# Patient Record
Sex: Male | Born: 1950 | Race: White | Hispanic: No | Marital: Married | Smoking: Former smoker
Health system: Southern US, Academic
[De-identification: ages and names within clinical notes are randomized; demographics above are authoritative.]

## PROBLEM LIST (undated history)

## (undated) DIAGNOSIS — C439 Malignant melanoma of skin, unspecified: Secondary | ICD-10-CM

## (undated) DIAGNOSIS — F419 Anxiety disorder, unspecified: Secondary | ICD-10-CM

## (undated) DIAGNOSIS — E785 Hyperlipidemia, unspecified: Secondary | ICD-10-CM

## (undated) DIAGNOSIS — I10 Essential (primary) hypertension: Secondary | ICD-10-CM

## (undated) DIAGNOSIS — C4491 Basal cell carcinoma of skin, unspecified: Secondary | ICD-10-CM

## (undated) HISTORY — PX: HX WISDOM TEETH EXTRACTION: SHX21

## (undated) HISTORY — PX: HX TONSILLECTOMY: SHX27

## (undated) HISTORY — DX: Malignant melanoma of skin, unspecified (CMS HCC): C43.9

## (undated) HISTORY — DX: Anxiety disorder, unspecified: F41.9

## (undated) HISTORY — PX: COLONOSCOPY: WVUENDOPRO10

---

## 1997-11-30 ENCOUNTER — Ambulatory Visit (HOSPITAL_COMMUNITY): Payer: Self-pay

## 2005-01-09 HISTORY — PX: HX LUMBAR SPINE SURGERY: 2100001196

## 2011-01-23 ENCOUNTER — Ambulatory Visit: Payer: BC Managed Care – PPO | Attending: Critical Care Medicine | Admitting: Critical Care Medicine

## 2011-01-23 ENCOUNTER — Encounter (INDEPENDENT_AMBULATORY_CARE_PROVIDER_SITE_OTHER): Payer: Self-pay | Admitting: Critical Care Medicine

## 2011-01-23 DIAGNOSIS — Z23 Encounter for immunization: Secondary | ICD-10-CM | POA: Insufficient documentation

## 2011-01-23 DIAGNOSIS — I10 Essential (primary) hypertension: Secondary | ICD-10-CM | POA: Insufficient documentation

## 2011-01-23 DIAGNOSIS — J189 Pneumonia, unspecified organism: Secondary | ICD-10-CM | POA: Insufficient documentation

## 2011-01-23 DIAGNOSIS — F411 Generalized anxiety disorder: Secondary | ICD-10-CM | POA: Insufficient documentation

## 2011-01-23 NOTE — Progress Notes (Signed)
Dictated

## 2011-01-24 NOTE — H&P (Signed)
[facility] AND [facility]                              DEPARTMENT OF MEDICINE                                [place], [place] [address]                                PATIENT NAME: Ian Ellis, Ian Ellis XBJYNW:[id number]  DATE OF SERVICE:01/23/2011  DATE OF BIRTH: 27-Nov-1950    HISTORY AND PHYSICAL    REFERRING AND PRIMARY CARE PHYSICIAN:  Jose Persia MD, [place], [place].    CHIEF COMPLAINT:  Nonresolving pneumonia versus other inflammatory process.    HISTORY OF PRESENT ILLNESS:  Mr. Leamer is a 61 year old gentleman being referred by Dr. Hal Hope for abnormal x-ray and slow to resolve pneumonia.  In November of 2012 the patient developed shortness of breath, fever and chills and was diagnosed November 28, 2010, with a pneumonia on chest x-ray.  The patient was given a script for Levaquin and came back in 1 week.  On the day of return, the patient felt better.  A CT scan was performed December 08, 2010, that showed ongoing right middle lobe opacity despite not having any symptoms at that time so the patient was referred to me for further evaluation.  Currently, Mr. Nevares denies any specific complaints.  He denies having current fevers, chills, nausea, vomiting, shortness of breath, cough or hemoptysis.  He feels significantly better than he did in November when he was diagnosed with pneumonia and since that time has really not had any issues whatsoever.  He continues to be a very active man, running anywhere from 7 to 9 every day and has not had any difficulty with that.    REVIEW OF SYSTEMS:  Positive for above, negative for all else.    PAST MEDICAL HISTORY:  1.  Hypertension.  2.  Melanoma, status post resection.  3.  Pneumonia x3, most recently in November, prior to that was many years ago.  4.  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  5.  Pancreatitis.    MEDICATIONS:  1.  Valium which he takes for anxiety approximately twice weekly.   2.  Voltaren which he takes for chronic back pain.  3.  Amlodipine.  4.  Ambien 10 mg 1 p.o. nightly p.r.n. which he takes approximately once every few days.  5.  Albuterol which he is not using at all.  He did use while he was acutely ill with the pneumonia but since that time he has not used it at all.      PAST SURGICAL HISTORY:  1.  Lumbar back surgery.  2.  Melanoma removal.  3.  Hemorrhoidectomy.  4.  Wisdom teeth removal.    ALLERGIES:  Amoxicillin which causes hives.    SOCIAL HISTORY:  He has been married 37 years.  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  He denies any exposure to asbestos, coal dust, silica or chemicals.  He did previously smoke a pack a day for approximately 10 years but quit 35 years ago.  He does occasionally smoke a cigar every 2-3 weeks.  He has 1 child who is healthy.  He does not live on  a farm and he has no pets at home.    FAMILY HISTORY:  Significant for coronary artery disease in his father and emphysema.  Mother has Alzheimer's.  There is no history of lung cancer in the family that he is aware of, or COPD or asthma.    OBJECTIVE:  He is 5 feet 9 inches, weighs 142 pounds, his temperature is 98.1 degrees Fahrenheit, pulse is 47, blood pressure is 130/76 and he is saturating 100% on room air.  Generally, he is alert and oriented.  Head is normocephalic, atraumatic.  ENT reveals eyes that are clear and mucous membranes that are moist.  Heart is regular rate and rhythm, normal S1, S2 with no murmur, rub or gallop.  Lungs are clear to auscultation bilaterally with no wheezes, rhonchi or rales.  Abdomen is soft, nontender.  Extremities show no edema, cyanosis or clubbing.    IMAGING:  CAT scan performed December 08, 2010, from the outside facility was reviewed by myself and showed anterior right middle lobe opacity that, if I had to guess, is resolving but I have no prior studies to compare it to.    ASSESSMENT AND PLAN:   1.  Resolving pneumonia.  We will have the patient repeat a CAT scan in approximately 2 weeks, which will be about 2 months out from his previous CAT scan, to ensure that this right middle lobe opacity is completely resolved.  If that is the case, then he will not need to follow with me any further.  However, if he still has any changes in his lungs on his CAT scan, we will evaluate that at that time possibly with a bronchoscopy.  2.  Immunizations.  The patient is up to date with his flu shot which he had in the fall and his pneumonia shot which he was encouraged to check with his primary care physician regarding.  3.  History of hypertension.  The patient is relatively well controlled now and should continue to follow with his primary care physician regarding this.  Continue his amlodipine.  4.  History of anxiety.  The patient should continue his Valium on a p.r.n. basis.  5.  Chronic back pain secondary to bulging disk.  The patient should continue his Voltaren and follow with his PCP.  6.  History of pancreatitis.  This is stable at this time.  7.  Return to clinic.  We will have the patient return to clinic in approximately 2 months if he has ongoing abnormal CT in 2 weeks.  In approximately 2 weeks we will have him do a CT chest noncontrast to evaluate his pulmonary parenchyma to assure this opacity is improved.  If it is not, we will evaluate him and make some more decisions at that time; specifically, bronchoscopy.      Lars Pinks, DO  Assistant Professor, Section of Pulmonary and Critical Care  [facility]    ZO/[id number]; D: 01/24/2011 08:25:06; T: 01/24/2011 10:00:56    cc: Rella Larve MD      [address] [place] [address]

## 2011-02-06 ENCOUNTER — Other Ambulatory Visit (HOSPITAL_COMMUNITY): Payer: Self-pay

## 2011-02-07 ENCOUNTER — Ambulatory Visit
Admission: RE | Admit: 2011-02-07 | Discharge: 2011-02-07 | Disposition: A | Discharge status: Home / Self Care | Payer: BC Managed Care – PPO | Source: Ambulatory Visit | Attending: Critical Care Medicine | Admitting: Critical Care Medicine

## 2011-02-07 DIAGNOSIS — R0602 Shortness of breath: Secondary | ICD-10-CM | POA: Insufficient documentation

## 2011-02-07 DIAGNOSIS — J189 Pneumonia, unspecified organism: Secondary | ICD-10-CM | POA: Insufficient documentation

## 2011-02-07 DIAGNOSIS — R942 Abnormal results of pulmonary function studies: Secondary | ICD-10-CM | POA: Insufficient documentation

## 2011-02-08 ENCOUNTER — Encounter (INDEPENDENT_AMBULATORY_CARE_PROVIDER_SITE_OTHER): Payer: Self-pay | Admitting: Critical Care Medicine

## 2011-02-08 ENCOUNTER — Other Ambulatory Visit (INDEPENDENT_AMBULATORY_CARE_PROVIDER_SITE_OTHER): Payer: Self-pay | Admitting: Critical Care Medicine

## 2011-02-08 NOTE — Progress Notes (Signed)
I reviewed Ian Ellis CT chest.  It shows minimal right middle lobe ground glass, drastically improved from his outside CT from 12/20/10.  He denies complaints.  This represents a slowly resolving pneumonia.  I will check a repeat CT chest non contrast in 3 months from now to ensure resolution.  This will be performed the same day as his follow up appointment.  We will cancel his 04/12/11 appointment.  Ian Ellis voiced understanding.      Reymundo Poll, DO  Assistant Professor  Pulmonary and Critical Care Medicine  Department of Internal Medicine

## 2011-04-12 ENCOUNTER — Encounter (INDEPENDENT_AMBULATORY_CARE_PROVIDER_SITE_OTHER): Payer: BC Managed Care – PPO | Admitting: Critical Care Medicine

## 2011-05-10 ENCOUNTER — Other Ambulatory Visit (HOSPITAL_COMMUNITY): Payer: Self-pay

## 2011-05-15 ENCOUNTER — Other Ambulatory Visit (HOSPITAL_COMMUNITY): Payer: Self-pay

## 2011-05-15 ENCOUNTER — Encounter (INDEPENDENT_AMBULATORY_CARE_PROVIDER_SITE_OTHER): Payer: BC Managed Care – PPO | Admitting: Critical Care Medicine

## 2011-05-24 ENCOUNTER — Ambulatory Visit
Admission: RE | Admit: 2011-05-24 | Discharge: 2011-05-24 | Disposition: A | Discharge status: Home / Self Care | Payer: BC Managed Care – PPO | Source: Ambulatory Visit | Attending: Critical Care Medicine | Admitting: Critical Care Medicine

## 2011-05-24 ENCOUNTER — Encounter (INDEPENDENT_AMBULATORY_CARE_PROVIDER_SITE_OTHER): Payer: Self-pay | Admitting: Critical Care Medicine

## 2011-05-24 ENCOUNTER — Ambulatory Visit (INDEPENDENT_AMBULATORY_CARE_PROVIDER_SITE_OTHER): Payer: BC Managed Care – PPO | Admitting: Critical Care Medicine

## 2011-05-24 VITALS — BP 128/67 | HR 47 | Temp 99.4°F | Ht 67.32 in | Wt 145.9 lb

## 2011-05-24 NOTE — Progress Notes (Signed)
Dictated

## 2011-05-25 NOTE — Progress Notes (Signed)
[facility] AND [facility]                              DEPARTMENT OF MEDICINE                                [place], [place] [address]                                PATIENT NAME: TRENTEN, WATCHMAN VHQION:[id number]  DATE OF SERVICE:05/24/2011  DATE OF BIRTH: 01-02-1951    PROGRESS NOTE    SUBJECTIVE:  Mr. Kiester is a regular patient in the pulmonary clinic following up for his history of abnormal CT that was most likely a slowly resolving pneumonia.  The patient reports doing very well at this time.  He denies fevers, chills, chest pain, shortness of breath, nausea, vomiting along with all else.  He did have his CAT scan performed today.    REVIEW OF SYSTEMS:  Positive for above, negative for all else.    OBJECTIVE:  He is 171 cm or 5 feet 7 inches tall. He weighs 66 kilograms or 145 pounds.  His temperature is 99.4.  Pulse is 47.  Blood pressure is 128/67.  He is saturating 96% on room air.  Generally, he is alert and oriented.  Head is normocephalic, atraumatic.  ENT reveals eyes that are clear.  Mucous membranes that are moist.  Heart is regular rate and rhythm, normal S1 and S2 with no murmur, rub or gallop.  Lungs are clear to auscultation bilaterally with no wheezes, rhonchi or rales.  Abdomen is soft, nontender, nondistended, positive bowel sounds.  Extremities show no edema, cyanosis or clubbing.      STUDIES:  CAT scan performed today revealed clear lung fields bilaterally with no signs of previous infiltrate.    ASSESSMENT AND PLAN:  1.  Slowly resolving pneumonia.  Previous abnormal CT chest likely just a slowly resolving pneumonia as his current CAT scan completely clear.  No other abnormal findings on his CT chest.  2.  Immunizations.  The patient is up to date with his flu shot, which he had in the fall and his pneumonia shot which he had in 2013.   3.  Return to clinic.  We will have the patient return to clinic on a p.r.n. basis.  He was encouraged to contact our office if he has any issues; but otherwise, we will see him as needed.      Lars Pinks, DO  Assistant Professor, Section of Pulmonary and Critical Care  [facility]    [id number]; D: 05/24/2011 16:16:25; T: 05/25/2011 00:48:37

## 2012-01-10 HISTORY — PX: HX HIP REPLACEMENT: SHX124

## 2013-08-07 ENCOUNTER — Ambulatory Visit (INDEPENDENT_AMBULATORY_CARE_PROVIDER_SITE_OTHER): Payer: BC Managed Care – PPO | Admitting: Otolaryngology

## 2014-01-09 HISTORY — PX: HX OTHER: 2100001105

## 2014-06-04 ENCOUNTER — Ambulatory Visit (HOSPITAL_BASED_OUTPATIENT_CLINIC_OR_DEPARTMENT_OTHER): Payer: 59 | Admitting: Neurological Surgery

## 2014-06-04 ENCOUNTER — Encounter (INDEPENDENT_AMBULATORY_CARE_PROVIDER_SITE_OTHER): Payer: Self-pay | Admitting: Neurological Surgery

## 2014-06-04 ENCOUNTER — Ambulatory Visit (HOSPITAL_BASED_OUTPATIENT_CLINIC_OR_DEPARTMENT_OTHER): Payer: 59

## 2014-06-04 ENCOUNTER — Ambulatory Visit
Admission: RE | Admit: 2014-06-04 | Discharge: 2014-06-04 | Disposition: A | Discharge status: Home / Self Care | Payer: 59 | Source: Ambulatory Visit | Attending: Neurological Surgery | Admitting: Neurological Surgery

## 2014-06-04 VITALS — BP 110/60 | HR 45 | Temp 96.5°F | Ht 69.49 in | Wt 158.7 lb

## 2014-06-04 DIAGNOSIS — M47816 Spondylosis without myelopathy or radiculopathy, lumbar region: Secondary | ICD-10-CM

## 2014-06-04 DIAGNOSIS — M4692 Unspecified inflammatory spondylopathy, cervical region: Secondary | ICD-10-CM

## 2014-06-04 DIAGNOSIS — M542 Cervicalgia: Secondary | ICD-10-CM | POA: Insufficient documentation

## 2014-06-04 DIAGNOSIS — M5412 Radiculopathy, cervical region: Secondary | ICD-10-CM | POA: Insufficient documentation

## 2014-06-04 DIAGNOSIS — M47892 Other spondylosis, cervical region: Secondary | ICD-10-CM

## 2014-06-04 DIAGNOSIS — M479 Spondylosis, unspecified: Secondary | ICD-10-CM

## 2014-06-04 DIAGNOSIS — Z87891 Personal history of nicotine dependence: Secondary | ICD-10-CM | POA: Insufficient documentation

## 2014-06-04 DIAGNOSIS — M5136 Other intervertebral disc degeneration, lumbar region: Secondary | ICD-10-CM

## 2014-06-04 DIAGNOSIS — M4312 Spondylolisthesis, cervical region: Secondary | ICD-10-CM

## 2014-06-04 DIAGNOSIS — M5030 Other cervical disc degeneration, unspecified cervical region: Secondary | ICD-10-CM

## 2014-06-04 DIAGNOSIS — R29890 Loss of height: Secondary | ICD-10-CM

## 2014-06-04 MED ORDER — CYCLOBENZAPRINE 10 MG TABLET
10.00 mg | ORAL_TABLET | Freq: Three times a day (TID) | ORAL | Status: AC
Start: 2014-06-04 — End: 2014-07-04

## 2014-06-04 NOTE — Addendum Note (Signed)
Addended by: Lindie Spruce on: 06/04/2014 12:04 PM     Modules accepted: Orders

## 2014-06-04 NOTE — Progress Notes (Addendum)
[facility]  New Outpatient/Consult    Ian Ellis  Date of Service: 06/04/2014  Referring physician: Call, Reymundo Poll, MD  [place], [place] [address]    Gender: male  Handedness: Left handed  Marital Status: Married   Job Title (or Former Job): Teacher (astronomy)    Chief Complaint:   Chief Complaint   Patient presents with    Lower Back Pain    Neck Pain     History is provided by patient    History of Present Illness  (Location/Quality/Severity/Duration/Timing/Context/Modifying factors/Associated S&S)      Past History  Current Outpatient Prescriptions   Medication Sig    amLODIPine (NORVASC) 5 mg Oral Tablet take 5 mg by mouth Once a day.      ASPIRIN/ACETAMINOPHEN/CAFFEINE (EXCEDRIN MIGRAINE ORAL) take 2 Tabs by mouth Every 12 hours as needed.      atorvastatin (LIPITOR) 20 mg Oral Tablet Take 20 mg by mouth Every evening    citalopram (CELEXA) 40 mg Oral Tablet Take 40 mg by mouth Once a day    docusate sodium (COLACE) 100 mg Oral Capsule take 100 mg by mouth Twice daily.      melatonin 10 mg Oral Tablet take 1 Tab by mouth Once a day.      MULTI-VITAMIN ORAL take 1 Tab by mouth Once a day.      oxyCODONE-acetaminophen (PERCOCET) 10-325 mg Oral Tablet Take 1 Tab by mouth Every 4 hours as needed for Pain    PSYLLIUM HUSK (METAMUCIL ORAL) take 1 Tab by mouth Once a day.      Trazodone 150 mg Oral Tablet Sustained Release 24 hr Take 100 mg by mouth Once a day     Allergies   Allergen Reactions    Amoxicillin Anaphylaxis and Hives/ Urticaria     Past Medical History   Diagnosis Date    Anxiety     Cancer          Past Surgical History   Procedure Laterality Date    Hx lumbar spine surgery      Hx tonsillectomy      Hx exposure to metal shavings      Hx metal implants in body      Hx hip replacement             Family History  Family History   Problem Relation Age of Onset    Alzheimer's/Dementia Mother     Emphysema Father            Social History  History      Social History    Marital Status: Married     Spouse Name: N/A    Number of Children: 1    Years of Education: N/A     Social History Main Topics    Smoking status: Former Smoker -- 1.00 packs/day for 10 years     Types: Cigars     Quit date: 01/23/1983    Smokeless tobacco: Not on file      Comment: occasional cigar    Alcohol Use: No    Drug Use: No    Sexual Activity: Not on file     Other Topics Concern    Uses Cane No    Uses Walker No    Uses Wheelchair No    Right Hand Dominant No    Left Hand Dominant Yes    Ambidextrous No    Shift Work No    Unusual Sleep-Wake  Schedule No     Social History Narrative       Review of Systems  Constitutional: negative  Eyes: negative  Ears, nose, mouth, throat, and face: negative  Respiratory: negative  Cardiovascular: negative  Gastrointestinal: negative  Genitourinary:negative  Integument/breast: positive for skin lesion(s)  Hematologic/lymphatic: negative  Musculoskeletal:positive for neck pain and back pain  Neurological: negative  Behavioral/Psych: positive for anxiety  Endocrine: negative  Allergic/Immunologic: negative  All other ROS Negative    Examination  BP 110/60 mmHg   Pulse 45   Temp(Src) 35.8 C (96.5 F) (Tympanic)   Ht 1.765 m (5' 9.49")   Wt 72 kg (158 lb 11.7 oz)   BMI 23.11 kg/m2   SpO2 93%  CURRENT WEIGHT: @WTENG @  BMI: @BMI1 @      Constitutional  Generally appearing stated age, not overweight, male, appropriate mood and affect.     NEUROLOGIC:  Alert and oriented x3.  Speech appropriate.  Cognition is intact. Memory intact. Attention span appropriate.    Cranial Nerves: Pupils equal and reactive to light, 3, 4, 6 good ocular motion.  Visual fields gross inspection intact. Facial sensation and motor intact.  No Nystagmus. Tongue midline.  Normal palate elevation.  Hearing intact to finger rub.  Eleven has good strength.   Motor:  Motor exam is 5/5 in all 4 extremities with exceptions:       Right   Left              Deltoids  Biceps  Triceps  Wrist Ext:  Wrist Flex  Grip  Hip Flex  Hip Ext  Quad  Hamstring  Plantar Flex.  Dorsal Flex.  EHL:  Foot eversion    Sensation: Sensation was intact to light touch, pain, temperature except 1/2 LT right C6  DTR:   Tricep 2+, Bicep 2+, Brachioradialis 2+ , Patellar 3+, Achilles 0. Hoffman negative B/L. Negative clonus of ankles with dorsiflexion.   Cerebellar: Exam is without dysmetria.  No past pointing. No ataxia  GAIT:  tandem, without cane or walker, standard cadence  SKIN EXAM:  No eschars or ulcerations throughout head, neck, RUE, LUE, RLE, LLE.  Warm, dry, pink  MUSCULOSKELETAL.  No muscular atrophy. No fasciculations. No spasticity. :  No joint deformity.   HEENT:  Normocephalic, atraumatic. Mucous membranes moist   Cardiovascular: Normal rate, normal rhythm,   Respiration: Equal chest rise. No wheezing. Non-rhonchus  Gastrointestinal:  Soft, non-tender, non-distended, non-obese    Data reviewed    LATEST RADIOLOGY RESULTS: No results were found from the past 30 days.    None to review    Discussions with other providers:     Assessment:    No diagnosis found.    Treatment Plan    The patient was seen independently.    [place] 06/04/2014, 11:10    CC:  Reymundo Poll Call, MD Call, Reymundo Poll, MD @REFER @    Late entry for 06/04/2014. I personally saw and evaluated the patient. See mid-level's note for additional details. My findings/participation are dictated    Lindie Spruce, MD

## 2014-06-04 NOTE — H&P (Signed)
[place], [place] [address]                                PATIENT NAME: Ian Ellis, Ian Ellis PRXYVO:[id number]  DATE OF SERVICE:06/04/2014  DATE OF BIRTH: 02-19-1950    HISTORY AND PHYSICAL    HISTORY OF PRESENT ILLNESS:  Ian Ellis is a very pleasant 64 year old male that presents to neurosurgery clinic with complaints of neck pain, right-sided arm radiating radicular symptoms and back pain without radiculopathy.  He has had back pain for years and underwent a minimally invasive L4-L5 decompression in the past and with good relief of his radiating symptoms but with chronic pain.  Over the last year, he started to develop neck pain with right side radiculopathy in a C6 distribution.  He had an MRI of the cervical spine and underwent a single epidural steroid injection which resolved this pain at least for 3-6 months, but now he states it is starting to become recurrent.  He presents in clinic with an MRI of the lumbar spine and a report of the cervical.    ASSESSMENT:  Ian Ellis is a 64 year old male that has neck pain and right C6 radiculopathy.  He has not been through physical therapy but has had Percocet pain medications but no muscle relaxants.  Per report, his MRI demonstrates that he has C5-C6 retrolisthesis and compression of his right nerve, concordant with his symptoms.  He underwent epidural steroid injection with relief and now it is recurrent.  His MRI of his back demonstrates L2-L3 and L3-L4 lumbar spondylosis.  He has actual back pain, but no radiculopathy.    RECOMMENDATIONS:  1.  Physical therapy 2 times a week for 4 weeks for neck and back exercises.  2.  Flexeril for muscle relaxants.  3.  Stop the Percocet and begin naproxen.  4.  Obtain his MRI of the cervical spine.  5.  He may benefit from an anterior cervical diskectomy and fusion at the C5-C6 but I need  to see the images.  Present back in 4 weeks.      Lamar Benes, MD  Assistant Professor  [facility] Department of Neurosurgery    MM/NO/[id number]; D: 06/04/2014 12:03:46; T: 06/04/2014 15:46:20

## 2014-06-25 ENCOUNTER — Ambulatory Visit (INDEPENDENT_AMBULATORY_CARE_PROVIDER_SITE_OTHER): Payer: Self-pay | Admitting: Neurological Surgery

## 2014-06-25 ENCOUNTER — Ambulatory Visit (INDEPENDENT_AMBULATORY_CARE_PROVIDER_SITE_OTHER): Payer: BC Managed Care – PPO | Admitting: Neurological Surgery

## 2014-06-25 NOTE — Telephone Encounter (Addendum)
PT order faxed.  Hamilton Capri, LPN  3/41/4436, 01:65        ----- Message from Ian Bushman sent at 06/24/2014  4:37 PM EDT -----  >> Ian Bushman 06/24/2014 04:37 PM  Skeet Simmer pt:  Judeen Hammans is calling from [facility] and they are needing a copy of the original PT script faxed to them. Please send to [phone].  Thanks.

## 2014-07-16 ENCOUNTER — Ambulatory Visit: Payer: 59 | Attending: Neurological Surgery | Admitting: Neurological Surgery

## 2014-07-16 ENCOUNTER — Encounter (INDEPENDENT_AMBULATORY_CARE_PROVIDER_SITE_OTHER): Payer: Self-pay | Admitting: Neurological Surgery

## 2014-07-16 VITALS — BP 132/78 | HR 48 | Temp 97.5°F | Ht 68.9 in | Wt 158.7 lb

## 2014-07-16 DIAGNOSIS — M47816 Spondylosis without myelopathy or radiculopathy, lumbar region: Secondary | ICD-10-CM

## 2014-07-16 DIAGNOSIS — M479 Spondylosis, unspecified: Secondary | ICD-10-CM | POA: Insufficient documentation

## 2014-07-16 DIAGNOSIS — M5412 Radiculopathy, cervical region: Secondary | ICD-10-CM

## 2014-07-16 DIAGNOSIS — M4816 Ankylosing hyperostosis [Forestier], lumbar region: Secondary | ICD-10-CM | POA: Insufficient documentation

## 2014-07-16 DIAGNOSIS — M4692 Unspecified inflammatory spondylopathy, cervical region: Secondary | ICD-10-CM

## 2014-07-16 NOTE — Progress Notes (Signed)
[facility]  Return Outpatient Note    Date:  07/16/2014  Age:  64 y.o.  Referring Physician:   Call, Reymundo Poll, [place] [id number]    Chief Complaint:   Chief Complaint   Patient presents with    Other       Subjective:    Ian Ellis is a very pleasant 64 year old male that presents to neurosurgery clinic with complaints of neck pain, right-sided arm radiating radicular symptoms and back pain without radiculopathy. He has had back pain for years and underwent a minimally invasive L4-L5 decompression in the past and with good relief of his radiating symptoms but with chronic pain. Over the last year, he started to develop neck pain with right side radiculopathy in a C6 distribution. He had an MRI of the cervical spine and underwent a single epidural steroid injection which resolved this pain at least for 3-6 months, but now he states it is starting to become recurrent. He presents in clinic with an MRI of the lumbar spine and a report of the cervical. He has completed PT.     Objective:   Vital Signs:  BP 132/78 mmHg   Pulse 48   Temp(Src) 36.4 C (97.5 F)   Ht 1.75 m (5' 8.9")   Wt 72 kg (158 lb 11.7 oz)   BMI 23.51 kg/m2   SpO2 97%  CURRENT WEIGHT: @WTENG @  BMI: @BMI1 @  dictated  Constitutional  Generally appearing stated age, not overweight, male, appropriate mood and affect.   NEUROLOGIC: Alert and oriented x3. Speech appropriate. Cognition is intact. Memory intact. Attention span appropriate.   CRANIAL NERVES: Pupils equal and reactive to light, 3, 4, 6 good ocular motion. Visual fields gross inspection intact. Facial sensation and motor intact. No Nystagmus. Tongue midline. Normal palate elevation. Hearing intact to finger rub. Eleven has good strength.   MOTOR: Motor exam is 5/5 in all 4 extremities with exceptions:   RightLeft    Deltoids  Biceps  Triceps  Wrist Ext:  Wrist Flex  Grip  Hip Flex  Hip Ext  Quad  Hamstring  Plantar Flex.  Dorsal Flex.  EHL:  Foot eversion    SENSATION: Sensation was intact to light touch, pain, temperature except 1/2 LT right C6  DTR: Tricep 2+, Bicep 2+, Brachioradialis 2+ , Patellar 3+, Achilles 0. Hoffman negative B/L. Negative clonus of ankles with dorsiflexion.   CEREBELLAR: Exam is without dysmetria. No past pointing. No ataxia  GAIT: tandem, without cane or walker, standard cadence  SKIN EXAM: No eschars or ulcerations throughout head, neck, RUE, LUE, RLE, LLE. Warm, dry, pink  MUSCULOSKELETAL. No muscular atrophy. No fasciculations. No spasticity. : No joint deformity.   HEENT: Normocephalic,Mucous membranes moist . 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 Cervical radiculopathy at C6 M54.12 BUN     CBC/DIFF     CREATININE     ELECTROLYTES  PT/INR     TYPE AND SCREEN     ECG 12-LEAD (PERFORMED IN PREADMISSION UNIT ONLY)     XR CHEST PA AND LATERAL     NURSE PRACTITIONER ANESTHESIA FOCUSED EVALUATION - PAU   3. Lumbar spondylosis M47.816 BUN     CBC/DIFF     CREATININE     ELECTROLYTES     PT/INR     TYPE AND SCREEN     ECG 12-LEAD (PERFORMED IN PREADMISSION UNIT ONLY)     XR CHEST PA AND LATERAL     NURSE PRACTITIONER ANESTHESIA FOCUSED EVALUATION - PAU     ASSESSMENT: Ian Ellis is a 64 year old male that has neck pain and right C6 radiculopathy. He has not been through physical therapy but has had Percocet pain medications but no muscle relaxants. Per report, his MRI demonstrates that he has C5-C6 retrolisthesis and compression of his  right nerve, concordant with his symptoms. 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 He underwent epidural steroid injection with relief and now it is recurrent. His MRI of his back demonstrates L2-L3 and L3-L4 lumbar spondylosis. He has actual back pain, but no radiculopathy.He has completed PT, NSAID, and muscle relaxant without impromvnent.     RECOMMENDATIONS:  1. OR August 19th ACDF C5-7 using the depuy system zero p va from the right  2. PAT  3. Consent   4. Labs

## 2014-08-12 ENCOUNTER — Encounter (INDEPENDENT_AMBULATORY_CARE_PROVIDER_SITE_OTHER): Payer: Self-pay

## 2014-08-12 ENCOUNTER — Ambulatory Visit
Admission: RE | Admit: 2014-08-12 | Discharge: 2014-08-12 | Disposition: A | Discharge status: Home / Self Care | Payer: 59 | Source: Ambulatory Visit | Attending: Neurological Surgery | Admitting: Neurological Surgery

## 2014-08-12 ENCOUNTER — Ambulatory Visit (HOSPITAL_BASED_OUTPATIENT_CLINIC_OR_DEPARTMENT_OTHER)
Admission: RE | Admit: 2014-08-12 | Discharge: 2014-08-12 | Disposition: A | Discharge status: Home / Self Care | Payer: 59 | Source: Ambulatory Visit | Attending: Neurological Surgery | Admitting: Neurological Surgery

## 2014-08-12 VITALS — BP 151/82 | HR 58 | Temp 98.6°F | Ht 68.7 in | Wt 161.4 lb

## 2014-08-12 DIAGNOSIS — M479 Spondylosis, unspecified: Secondary | ICD-10-CM

## 2014-08-12 DIAGNOSIS — M5412 Radiculopathy, cervical region: Secondary | ICD-10-CM

## 2014-08-12 DIAGNOSIS — R001 Bradycardia, unspecified: Secondary | ICD-10-CM

## 2014-08-12 DIAGNOSIS — R9431 Abnormal electrocardiogram [ECG] [EKG]: Secondary | ICD-10-CM

## 2014-08-12 DIAGNOSIS — M47816 Spondylosis without myelopathy or radiculopathy, lumbar region: Secondary | ICD-10-CM

## 2014-08-12 DIAGNOSIS — Z8719 Personal history of other diseases of the digestive system: Secondary | ICD-10-CM

## 2014-08-12 DIAGNOSIS — M4692 Unspecified inflammatory spondylopathy, cervical region: Secondary | ICD-10-CM

## 2014-08-12 DIAGNOSIS — Z01818 Encounter for other preprocedural examination: Secondary | ICD-10-CM

## 2014-08-12 HISTORY — DX: Essential (primary) hypertension: I10

## 2014-08-12 HISTORY — DX: Basal cell carcinoma of skin, unspecified: C44.91

## 2014-08-12 HISTORY — DX: Hyperlipidemia, unspecified: E78.5

## 2014-08-12 LAB — CBC WITH DIFF
BASOPHIL #: 0.04 x10ˆ3/uL (ref 0.00–0.20)
BASOPHIL %: 1 %
EOSINOPHIL #: 0.12 x10ˆ3/uL (ref 0.00–0.50)
EOSINOPHIL %: 2 %
EOSINOPHIL %: 2 %
HCT: 45.1 % (ref 36.7–47.0)
HGB: 15 g/dL (ref 12.5–16.3)
LYMPHOCYTE #: 1.72 10*3/uL (ref 1.00–4.80)
LYMPHOCYTE #: 1.72 x10ˆ3/uL (ref 1.00–4.80)
LYMPHOCYTE %: 31 %
MCH: 29 pg (ref 27.4–33.0)
MCHC: 33.2 g/dL (ref 32.5–35.8)
MCV: 87.4 fL (ref 78.0–100.0)
MONOCYTE #: 0.38 x10ˆ3/uL (ref 0.30–1.00)
MONOCYTE %: 7 %
MPV: 8.6 fL (ref 7.5–11.5)
MPV: 8.6 fL (ref 7.5–11.5)
NEUTROPHIL #: 3.33 x10ˆ3/uL (ref 1.50–7.70)
NEUTROPHIL %: 60 %
PLATELETS: 194 x10ˆ3/uL (ref 140–450)
RBC: 5.16 x10?6/uL (ref 4.06–5.63)
RBC: 5.16 x10ˆ6/uL (ref 4.06–5.63)
RDW: 14.2 % (ref 12.0–15.0)
RDW: 14.2 % (ref 12.0–15.0)
WBC: 5.6 x10ˆ3/uL (ref 3.5–11.0)

## 2014-08-12 LAB — TYPE AND SCREEN
ABO/RH(D): A NEG
ANTIBODY SCREEN: NEGATIVE

## 2014-08-12 LAB — CREATININE WITH EGFR
CREATININE: 1.04 mg/dL (ref 0.62–1.27)
ESTIMATED GFR: >59 mL/min/1.73mˆ2 (ref >59)

## 2014-08-12 LAB — ELECTROLYTES
ANION GAP: 8 mmol/L (ref 4–13)
CHLORIDE: 105 mmol/L (ref 96–111)
CO2 TOTAL: 27 mmol/L (ref 22–32)
CO2 TOTAL: 27 mmol/L (ref 22–32)
POTASSIUM: 4.4 mmol/L (ref 3.5–5.1)
SODIUM: 140 mmol/L (ref 136–145)

## 2014-08-12 LAB — PT/INR
INR: 1.01 (ref 0.80–1.20)
PROTHROMBIN TIME: 11.3 s (ref 9.0–13.4)

## 2014-08-12 LAB — BUN: BUN: 13 mg/dL (ref 8–25)

## 2014-08-12 NOTE — Anesthesia Preprocedure Evaluation (Addendum)
Physical Exam:     Airway       Mallampati: II    TM distance: >3 FB    Neck ROM: limited  Mouth Opening: good.  No Facial hair    No endotracheal tube present  No Tracheostomy present    Dental       Dentition intact             Pulmonary           Cardiovascular    Rhythm: regular  Rate: Normal  (-) no friction rub, carotid bruit is not present, no peripheral edema and no murmur     Other findings            Anesthesia Plan:  Planned anesthesia type: general  ASA 2     Intravenous induction   Anesthetic plan and risks discussed with patient.     Anesthesia issues/risks discussed are: Dental Injuries, PONV, Post-op Pain Management and Cardiac Events/MI.        Patient's NPO status is appropriate for Anesthesia.         Plan discussed with CRNA.            EKG Ordered: 08/12/2014   Recent Results (from the past 720 hour(s))   ECG 12-LEAD (PERFORMED IN PREADMISSION UNIT ONLY)    Collection Time: 08/12/14 1:28 PM   Result Value    Ventricular rate 49    Atrial Rate 49    PR Interval 186    QRS Duration 88    QT Interval 440    QTC Calculation 397    Calculated P Axis 60    Calculated R Axis 64    Calculated T Axis 29    Narrative    Marked sinus bradycardia  J-point elevation; Early repolarization  Abnormal ECG  No previous ECGs available  Confirmed by MILLS MD, Kalvyn ([id number]) on 08/17/2014 10:58:34 AM          CXR Ordered:  08/12/2014     Recent Results (from the past 720 hour(s))   XR CHEST PA AND LATERAL    Collection Time: 08/12/14 1:36 PM    Narrative    XR CHEST PA AND LATERAL performed on Aug 12, 2014 1:36 PM.    INDICATION: 64 y.o. male with Cervical spondylitis  Cervical radiculopathy at C6  Lumbar spondylosis.    TECHNIQUE: Frontal and lateral views of the chest.    FINDINGS: No priors available for comparison. The heart size is within   normal limits. The lungs are clear. The bones demonstrate degenerative   changes of the spine and are otherwise unremarkable.      Impression    No  acute cardiopulmonary process.          Other Studies: labs in PAU  Recent Labs    08/12/14  1320   WBC 5.6   HGB 15.0   HCT 45.1   PLTCNT 194       Recent Labs    08/12/14  1320   RBC 5.16   HCT 45.1   HGB 15.0   WBC 5.6   MCHC 33.2   MCH 29.0   RDW 14.2   MPV 8.6   MCV 87.4   PLTCNT 194    Recent Labs    08/12/14  1320   PROTHROMTME 11.3   INR 1.01   ABORHD A NEGATIVE   ABSCR NEGATIVE   UNTORDERED NOT STATED        Recent Labs  08/12/14  1320   INR 1.01   PROTHROMTME 11.3        BMP Results ABG Results   Recent Labs    08/12/14  1248 08/12/14  1320   SODIUM  --  140   POTASSIUM  --  4.4   CHLORIDE  --  105   CO2  --  27   BUN  --  13   CREATININE  --  1.04   GLUIP 83  --             Above information entered by Steva Colder, APRN  08/17/2014, 15:12  Consults: None    Patient instructed to take all medications day of surgery.Instructed to hold all vitamins, ASA, and fish oil 1 week prior to surgery.    Patient provided anesthesia consent in PAT. Instructed to review consent prior to OR date. Educated that consent will be signed morning of surgery with anesthesiologist.

## 2014-08-13 LAB — PERFORM POC WHOLE BLOOD GLUCOSE: GLUCOSE, POC: 83 mg/dL (ref 70–105)

## 2014-08-14 ENCOUNTER — Encounter (INDEPENDENT_AMBULATORY_CARE_PROVIDER_SITE_OTHER): Payer: Self-pay

## 2014-08-17 LAB — ECG 12-LEAD (PERFORMED IN PREADMISSION UNIT ONLY)
Atrial Rate: 49 {beats}/min
Calculated P Axis: 60 degrees
Calculated R Axis: 64 degrees
Calculated T Axis: 29 degrees
PR Interval: 186 ms
QRS Duration: 88 ms
QRS Duration: 88 ms
QT Interval: 440 ms
QT Interval: 440 ms
QTC Calculation: 397 ms
Ventricular rate: 49 {beats}/min

## 2014-08-27 MED ORDER — CLINDAMYCIN 600 MG/50 ML IN 5 % DEXTROSE INTRAVENOUS PIGGYBACK
600.0000 mg | INJECTION | Freq: Once | INTRAVENOUS | Status: DC
Start: 2014-08-28 — End: 2014-08-28
  Filled 2014-08-27: qty 50

## 2014-08-27 MED ORDER — VANCOMYCIN 1 GRAM/200 ML IN DEXTROSE 5 % INTRAVENOUS PIGGYBACK
15.0000 mg/kg | INJECTION | Freq: Once | INTRAVENOUS | Status: AC
Start: 2014-08-28 — End: 2014-08-28
  Administered 2014-08-28: 1000 mg via INTRAVENOUS
  Filled 2014-08-27: qty 200

## 2014-08-28 ENCOUNTER — Inpatient Hospital Stay
Admission: RE | Admit: 2014-08-28 | Discharge: 2014-08-29 | DRG: 473 | Disposition: A | Discharge status: Home / Self Care | Payer: 59 | Source: Ambulatory Visit | Attending: Neurological Surgery | Admitting: Neurological Surgery

## 2014-08-28 ENCOUNTER — Encounter (HOSPITAL_COMMUNITY)
Admission: RE | Disposition: A | Discharge status: Home / Self Care | Payer: Self-pay | Source: Ambulatory Visit | Attending: Neurological Surgery

## 2014-08-28 ENCOUNTER — Inpatient Hospital Stay (HOSPITAL_COMMUNITY)
Admission: RE | Admit: 2014-08-28 | Discharge: 2014-08-28 | Disposition: A | Discharge status: Home / Self Care | Payer: 59 | Source: Ambulatory Visit

## 2014-08-28 ENCOUNTER — Inpatient Hospital Stay (HOSPITAL_COMMUNITY): Payer: 59 | Admitting: Family

## 2014-08-28 ENCOUNTER — Inpatient Hospital Stay (HOSPITAL_COMMUNITY): Payer: 59 | Admitting: Anesthesiology

## 2014-08-28 ENCOUNTER — Inpatient Hospital Stay (HOSPITAL_COMMUNITY): Payer: 59

## 2014-08-28 ENCOUNTER — Encounter (HOSPITAL_COMMUNITY): Payer: Self-pay | Admitting: Registered Nurse

## 2014-08-28 ENCOUNTER — Inpatient Hospital Stay (HOSPITAL_COMMUNITY): Payer: 59 | Admitting: Neurological Surgery

## 2014-08-28 DIAGNOSIS — M4722 Other spondylosis with radiculopathy, cervical region: Secondary | ICD-10-CM

## 2014-08-28 DIAGNOSIS — Z85828 Personal history of other malignant neoplasm of skin: Secondary | ICD-10-CM

## 2014-08-28 DIAGNOSIS — F419 Anxiety disorder, unspecified: Secondary | ICD-10-CM | POA: Diagnosis present

## 2014-08-28 DIAGNOSIS — M542 Cervicalgia: Secondary | ICD-10-CM

## 2014-08-28 DIAGNOSIS — M5022 Other cervical disc displacement, mid-cervical region: Secondary | ICD-10-CM

## 2014-08-28 DIAGNOSIS — I10 Essential (primary) hypertension: Secondary | ICD-10-CM | POA: Diagnosis present

## 2014-08-28 DIAGNOSIS — M4312 Spondylolisthesis, cervical region: Secondary | ICD-10-CM

## 2014-08-28 DIAGNOSIS — Z9889 Other specified postprocedural states: Secondary | ICD-10-CM

## 2014-08-28 DIAGNOSIS — M5031 Other cervical disc degeneration,  high cervical region: Secondary | ICD-10-CM

## 2014-08-28 DIAGNOSIS — Z96641 Presence of right artificial hip joint: Secondary | ICD-10-CM | POA: Diagnosis present

## 2014-08-28 DIAGNOSIS — Z87891 Personal history of nicotine dependence: Secondary | ICD-10-CM

## 2014-08-28 DIAGNOSIS — M4802 Spinal stenosis, cervical region: Secondary | ICD-10-CM | POA: Diagnosis present

## 2014-08-28 DIAGNOSIS — E785 Hyperlipidemia, unspecified: Secondary | ICD-10-CM | POA: Diagnosis present

## 2014-08-28 HISTORY — PX: HX ACDF: 2100001346

## 2014-08-28 SURGERY — DISCECTOMY SPINE CERVICAL ANTERIOR MICRO WITH FUSION AND INSTRUMENTATION 2 LEVELS
Anesthesia: General | Site: Spine Cervical | Wound class: Clean Wound: Uninfected operative wounds in which no inflammation occurred

## 2014-08-28 MED ORDER — THROMBIN (RECOMBINANT) 5,000 UNIT TOPICAL SOLUTION
CUTANEOUS | Status: AC
Start: 2014-08-28 — End: 2014-08-28
  Filled 2014-08-28: qty 2

## 2014-08-28 MED ORDER — SODIUM CHLORIDE 0.9 % (FLUSH) INJECTION SYRINGE
2.00 mL | INJECTION | Freq: Three times a day (TID) | INTRAMUSCULAR | Status: DC
Start: 2014-08-28 — End: 2014-08-29
  Administered 2014-08-28: 2 mL
  Administered 2014-08-28: 0 mL
  Administered 2014-08-29: 2 mL

## 2014-08-28 MED ORDER — SURGIFOAM SIZE 100 CM SPONGE
1.00 | VAGINAL_SPONGE | Freq: Once | CUTANEOUS | Status: DC | PRN
Start: 2014-08-28 — End: 2014-08-28
  Administered 2014-08-28: 1 via TOPICAL

## 2014-08-28 MED ORDER — OXYCODONE-ACETAMINOPHEN 5 MG-325 MG TABLET
1.0000 | ORAL_TABLET | ORAL | Status: DC | PRN
Start: 2014-08-28 — End: 2014-08-29

## 2014-08-28 MED ORDER — ONDANSETRON HCL (PF) 4 MG/2 ML INJECTION SOLUTION
4.0000 mg | Freq: Four times a day (QID) | INTRAMUSCULAR | Status: DC | PRN
Start: 2014-08-28 — End: 2014-08-29

## 2014-08-28 MED ORDER — ROCURONIUM 10 MG/ML INTRAVENOUS SOLUTION
Freq: Once | INTRAVENOUS | Status: DC | PRN
Start: 2014-08-28 — End: 2014-08-28
  Administered 2014-08-28: 50 mg via INTRAVENOUS

## 2014-08-28 MED ORDER — VANCOMYCIN 1 GRAM/200 ML IN DEXTROSE 5 % INTRAVENOUS PIGGYBACK
15.0000 mg/kg | INJECTION | Freq: Once | INTRAVENOUS | Status: DC
Start: 2014-08-28 — End: 2014-08-28

## 2014-08-28 MED ORDER — SODIUM CHLORIDE 0.9 % INTRAVENOUS SOLUTION
INTRAVENOUS | Status: DC | PRN
Start: 2014-08-28 — End: 2014-08-28

## 2014-08-28 MED ORDER — SURGIFOAM SIZE 100 CM SPONGE
VAGINAL_SPONGE | CUTANEOUS | Status: AC
Start: 2014-08-28 — End: 2014-08-28
  Filled 2014-08-28: qty 1

## 2014-08-28 MED ORDER — ONDANSETRON HCL (PF) 4 MG/2 ML INJECTION SOLUTION
Freq: Once | INTRAMUSCULAR | Status: DC | PRN
Start: 2014-08-28 — End: 2014-08-28
  Administered 2014-08-28: 4 mg via INTRAVENOUS

## 2014-08-28 MED ORDER — FENTANYL (PF) 50 MCG/ML INJECTION SOLUTION
Freq: Once | INTRAMUSCULAR | Status: DC | PRN
Start: 2014-08-28 — End: 2014-08-28
  Administered 2014-08-28: 100 ug via INTRAVENOUS
  Administered 2014-08-28: 50 ug via INTRAVENOUS

## 2014-08-28 MED ORDER — PROPOFOL 10 MG/ML IV BOLUS
INJECTION | Freq: Once | INTRAVENOUS | Status: DC | PRN
Start: 2014-08-28 — End: 2014-08-28
  Administered 2014-08-28: 200 mg via INTRAVENOUS

## 2014-08-28 MED ORDER — HYDROMORPHONE 1 MG/ML INJECTION WRAPPER
0.40 mg | INJECTION | INTRAMUSCULAR | Status: DC | PRN
Start: 2014-08-28 — End: 2014-08-28

## 2014-08-28 MED ORDER — ACETAMINOPHEN 1,000 MG/100 ML (10 MG/ML) INTRAVENOUS SOLUTION
Freq: Once | INTRAVENOUS | Status: DC | PRN
Start: 2014-08-28 — End: 2014-08-28
  Administered 2014-08-28: 1000 mg via INTRAVENOUS

## 2014-08-28 MED ORDER — LIDOCAINE 1 %-EPINEPHRINE 1:100,000 INJECTION SOLUTION
INTRAMUSCULAR | Status: AC
Start: 2014-08-28 — End: 2014-08-28
  Filled 2014-08-28: qty 20

## 2014-08-28 MED ORDER — SODIUM CHLORIDE 0.9 % INTRAVENOUS SOLUTION
6.25 mg | Freq: Once | INTRAVENOUS | Status: DC | PRN
Start: 2014-08-28 — End: 2014-08-28

## 2014-08-28 MED ORDER — GELATIN MATRIX SEALANT (FLOSEAL) 10 ML KIT
PACK | Freq: Once | CUTANEOUS | Status: DC | PRN
Start: 2014-08-28 — End: 2014-08-28

## 2014-08-28 MED ORDER — ATORVASTATIN 10 MG TABLET
20.00 mg | ORAL_TABLET | Freq: Every evening | ORAL | Status: DC
Start: 2014-08-28 — End: 2014-08-29
  Administered 2014-08-28: 20 mg via ORAL
  Filled 2014-08-28 (×2): qty 2

## 2014-08-28 MED ORDER — ESCITALOPRAM 20 MG TABLET
20.0000 mg | ORAL_TABLET | Freq: Every day | ORAL | Status: DC
Start: 2014-08-29 — End: 2014-08-29
  Administered 2014-08-29: 20 mg via ORAL
  Filled 2014-08-28: qty 1

## 2014-08-28 MED ORDER — ONDANSETRON HCL (PF) 4 MG/2 ML INJECTION SOLUTION
4.00 mg | Freq: Once | INTRAMUSCULAR | Status: DC | PRN
Start: 2014-08-28 — End: 2014-08-28

## 2014-08-28 MED ORDER — AMLODIPINE 5 MG TABLET
5.0000 mg | ORAL_TABLET | Freq: Every day | ORAL | Status: DC
Start: 2014-08-29 — End: 2014-08-29
  Administered 2014-08-29: 5 mg via ORAL
  Filled 2014-08-28: qty 1

## 2014-08-28 MED ORDER — LIDOCAINE (PF) 100 MG/5 ML (2 %) INTRAVENOUS SYRINGE
INJECTION | Freq: Once | INTRAVENOUS | Status: DC | PRN
Start: 2014-08-28 — End: 2014-08-28

## 2014-08-28 MED ORDER — HYDROMORPHONE 1 MG/ML INJECTION WRAPPER
0.20 mg | INJECTION | INTRAMUSCULAR | Status: DC | PRN
Start: 2014-08-28 — End: 2014-08-28

## 2014-08-28 MED ORDER — HYDROMORPHONE 1 MG/ML INJECTION WRAPPER
0.2000 mg | INJECTION | INTRAMUSCULAR | Status: DC | PRN
Start: 2014-08-28 — End: 2014-08-29

## 2014-08-28 MED ORDER — SODIUM CHLORIDE 0.9 % (FLUSH) INJECTION SYRINGE
2.00 mL | INJECTION | INTRAMUSCULAR | Status: DC | PRN
Start: 2014-08-28 — End: 2014-08-29

## 2014-08-28 MED ORDER — EPHEDRINE SULFATE 50 MG/ML INJECTION SOLUTION
Freq: Once | INTRAMUSCULAR | Status: DC | PRN
Start: 2014-08-28 — End: 2014-08-28
  Administered 2014-08-28 (×2): 10 mg via INTRAVENOUS

## 2014-08-28 MED ORDER — NEOSTIGMINE METHYLSULFATE 1 MG/ML INTRAVENOUS SOLUTION
Freq: Once | INTRAVENOUS | Status: DC | PRN
Start: 2014-08-28 — End: 2014-08-28
  Administered 2014-08-28: 4 mg via INTRAVENOUS

## 2014-08-28 MED ORDER — ACETAMINOPHEN 325 MG TABLET
650.0000 mg | ORAL_TABLET | Freq: Four times a day (QID) | ORAL | Status: DC | PRN
Start: 2014-08-28 — End: 2014-08-29

## 2014-08-28 MED ORDER — BACITRACIN 50,000 UNIT INTRAMUSCULAR SOLUTION
50000.00 [IU] | INTRAMUSCULAR | Status: DC | PRN
Start: 2014-08-28 — End: 2014-08-28
  Administered 2014-08-28: 50000 [IU]

## 2014-08-28 MED ORDER — MIDAZOLAM 1 MG/ML INJECTION SOLUTION
Freq: Once | INTRAMUSCULAR | Status: DC | PRN
Start: 2014-08-28 — End: 2014-08-28
  Administered 2014-08-28: 2 mg via INTRAVENOUS

## 2014-08-28 MED ORDER — BACITRACIN 500 UNIT/G OINTMENT TUBE
TOPICAL_OINTMENT | Freq: Once | CUTANEOUS | Status: DC | PRN
Start: 2014-08-28 — End: 2014-08-28

## 2014-08-28 MED ORDER — GLYCOPYRROLATE 0.2 MG/ML INJECTION SOLUTION
Freq: Once | INTRAMUSCULAR | Status: DC | PRN
Start: 2014-08-28 — End: 2014-08-28
  Administered 2014-08-28 (×2): 0.2 mg via INTRAVENOUS
  Administered 2014-08-28: 0.6 mg via INTRAVENOUS

## 2014-08-28 MED ORDER — TRAZODONE 50 MG TABLET
100.00 mg | ORAL_TABLET | Freq: Every evening | ORAL | Status: DC
Start: 2014-08-28 — End: 2014-08-29
  Administered 2014-08-28: 100 mg via ORAL
  Filled 2014-08-28 (×2): qty 2

## 2014-08-28 MED ORDER — DOCUSATE SODIUM 100 MG CAPSULE
100.0000 mg | ORAL_CAPSULE | Freq: Two times a day (BID) | ORAL | Status: DC
Start: 2014-08-28 — End: 2014-08-29
  Administered 2014-08-28 – 2014-08-29 (×2): 100 mg via ORAL
  Filled 2014-08-28 (×3): qty 1

## 2014-08-28 MED ORDER — HYDRALAZINE 20 MG/ML INJECTION SOLUTION
10.0000 mg | INTRAMUSCULAR | Status: DC | PRN
Start: 2014-08-28 — End: 2014-08-29

## 2014-08-28 MED ORDER — THROMBIN (RECOMBINANT) 5,000 UNIT TOPICAL SOLUTION
Freq: Once | CUTANEOUS | Status: DC | PRN
Start: 2014-08-28 — End: 2014-08-28

## 2014-08-28 MED ORDER — LIDOCAINE 1 %-EPINEPHRINE 1:100,000 INJECTION SOLUTION
15.00 mL | Freq: Once | INTRAMUSCULAR | Status: DC | PRN
Start: 2014-08-28 — End: 2014-08-28
  Administered 2014-08-28: 10 mL via INTRAMUSCULAR

## 2014-08-28 MED ORDER — ALUMINUM-MAG HYDROXIDE-SIMETHICONE 400 MG-400 MG-40 MG/5 ML ORAL SUSP
5.0000 mL | ORAL | Status: DC | PRN
Start: 2014-08-28 — End: 2014-08-29

## 2014-08-28 MED ORDER — DIPHENHYDRAMINE 25 MG CAPSULE
25.0000 mg | ORAL_CAPSULE | Freq: Four times a day (QID) | ORAL | Status: DC | PRN
Start: 2014-08-28 — End: 2014-08-28

## 2014-08-28 MED ORDER — DEXAMETHASONE SODIUM PHOSPHATE 4 MG/ML INJECTION SOLUTION
Freq: Once | INTRAMUSCULAR | Status: DC | PRN
Start: 2014-08-28 — End: 2014-08-28
  Administered 2014-08-28: 8 mg via INTRAVENOUS

## 2014-08-28 MED ORDER — FAMOTIDINE 20 MG TABLET
20.0000 mg | ORAL_TABLET | Freq: Two times a day (BID) | ORAL | Status: DC
Start: 2014-08-28 — End: 2014-08-29
  Administered 2014-08-28 – 2014-08-29 (×2): 20 mg via ORAL
  Filled 2014-08-28 (×3): qty 1

## 2014-08-28 MED ORDER — BACITRACIN 500 UNIT/G OINTMENT TUBE
TOPICAL_OINTMENT | CUTANEOUS | Status: AC
Start: 2014-08-28 — End: 2014-08-28
  Filled 2014-08-28: qty 15

## 2014-08-28 MED ORDER — POTASSIUM CHLORIDE 20 MEQ/L IN 0.9 % SODIUM CHLORIDE INTRAVENOUS
INTRAVENOUS | Status: DC
Start: 2014-08-28 — End: 2014-08-29

## 2014-08-28 MED ORDER — OXYCODONE-ACETAMINOPHEN 5 MG-325 MG TABLET
2.0000 | ORAL_TABLET | ORAL | Status: DC | PRN
Start: 2014-08-28 — End: 2014-08-29
  Administered 2014-08-28 – 2014-08-29 (×3): 2 via ORAL
  Filled 2014-08-28 (×4): qty 2

## 2014-08-28 MED ORDER — LACTATED RINGERS INTRAVENOUS SOLUTION
INTRAVENOUS | Status: DC
Start: 2014-08-28 — End: 2014-08-28
  Administered 2014-08-28: 0 via INTRAVENOUS

## 2014-08-28 MED ORDER — SENNOSIDES 8.6 MG-DOCUSATE SODIUM 50 MG TABLET
1.0000 | ORAL_TABLET | Freq: Every day | ORAL | Status: DC
Start: 2014-08-29 — End: 2014-08-29
  Administered 2014-08-29: 1 via ORAL
  Filled 2014-08-28: qty 1

## 2014-08-28 MED ORDER — DIPHENHYDRAMINE 25 MG CAPSULE
25.0000 mg | ORAL_CAPSULE | Freq: Every evening | ORAL | Status: DC | PRN
Start: 2014-08-28 — End: 2014-08-28

## 2014-08-28 MED ADMIN — heparin lock flush (porcine) 10 unit/mL intravenous solution: INTRAVENOUS | @ 19:00:00

## 2014-08-28 MED ADMIN — lactated Ringers intravenous solution: INTRAVENOUS | @ 16:00:00 | NDC 00264775000

## 2014-08-28 MED ADMIN — bicarbonate hemodialysis solution no.2 K 2 mEq-Ca 3.5 mEq-Mg 1 mEq/L: ORAL | @ 22:00:00 | NDC 24571010306

## 2014-08-28 MED ADMIN — ePHEDrine sulfate 50 mg/mL injection solution: INTRAVENOUS | @ 18:00:00

## 2014-08-28 MED ADMIN — sodium chloride 0.9 % intravenous solution: INTRAVENOUS | @ 22:00:00 | NDC 00338004903

## 2014-08-28 MED ADMIN — sennosides 8.6 mg-docusate sodium 50 mg tablet: @ 18:00:00

## 2014-08-28 MED ADMIN — multivitamin tablet: ORAL | @ 21:00:00

## 2014-08-28 SURGICAL SUPPLY — 62 items
14mm ST Screw ×4 IMPLANT
16mm ST screw ×1 IMPLANT
ADH LIQUID LF  WTPRF VIAL PREP NONSTAIN MASTISOL STYRAX GUM MASTIC ALC MTHY SLCYT STRL CLR NHZR 2/3 (SEALANTS) ×1 IMPLANT
ADH LQ LF VIAL AMP PREP MASTI_SOL STYRAX GUM MASTIC ALC MTHY (SEALANTS) ×1
ADHESIVE TISSUE EXOFIN 1.0ML_PREMIERPRO EXOFIN (SEALANTS) ×1
APPL 70% ISPRP 2% CHG 26ML 13._2X13.2IN CHLRPRP PREP DEHP-FR (WOUND CARE/ENTEROSTOMAL SUPPLY) ×2
APPL 70% ISPRP 2% CHG 26ML CHLRPRP HI-LT ORNG PREP STRL LF  DISP CLR (WOUND CARE SUPPLY) ×1 IMPLANT
BIT DRILL 3.0MM FLUTED 15CM_MATCHSTICK (CUTTING ELEMENTS) ×1
BLANKET 3M BAIR HUG ADLT LWR B ODY 60X36IN PLMR AIR SYS LTWT (MISCELLANEOUS PT CARE ITEMS) ×2 IMPLANT
BURR SURG 3MM MTCHSTK FLUTE REPRO STRL LF  DISP (SURGICAL CUTTING SUPPLIES) ×1 IMPLANT
CATH IV 14GA 1.75IN SHIELD NTCH NEEDLE PSHBTN DEHP-FR BD VLN INST ATGRD PERI STD STRL LF  DISP ORNG (IV TUBING & ACCESSORIES) ×1 IMPLANT
CATH IV 14GA 1.75IN SHLD NTCH_NEEDLE PSHBTN DEHP-FR BD VLN (IV TUBING & ACCESSORIES) ×1
CLOSURE SKIN STRIPS 1/2X4IN_R1547 6/PK 50PK/BX (WOUND CARE/ENTEROSTOMAL SUPPLY) ×1
CONV USE 67669 - SYRINGE FLUSH SAL PRSV FR PREFL 10ML LL NDLS IV ACCESS SYS STRL LF (NEEDLES & SYRINGE SUPPLIES) ×2 IMPLANT
CONV USE ITEM 156524 - ADHESIVE TISSUE EXOFIN 1.0ML_PREMIERPRO EXOFIN (SEALANTS) ×1 IMPLANT
COVER EXTBL CASSETTE BIG (EQUIPMENT MINOR) ×1 IMPLANT
DRAIN SUCT RND 10FR_SU130-1321 (WOUND CARE SUPPLY) ×1 IMPLANT
DRAIN SUCT RND 10FR_SU130-1321 (WOUND CARE/ENTEROSTOMAL SUPPLY) ×1
DRAPE 6IN BIG CA BCK TBL HVDTY_LF STRL DISP EQP (EQUIPMENT MINOR) ×1
DRAPE CARM FLRSCP EXPD CLPSBL C-ARMOR STRL EQP (DRAPE/PACKS/SHEETS/OR TOWEL) ×1 IMPLANT
DRAPE CARM FLRSCP EXPD CLPSBL_C-ARMOR STRL EQP (DRAPE/PACKS/SHEETS/OR TOWEL) ×1
DRAPE CARM POLY STRAP MBL XRY 72X42IN LF  EQP (DRAPE/PACKS/SHEETS/OR TOWEL) ×2 IMPLANT
DRAPE CARM POLY STRAP MBL XRY_72X42IN LF EQP (DRAPE/PACKS/SHEETS/OR TOWEL) ×2
DRAPE FNFLD ABS REINF 77X53IN 43528 PRXM LF  STRL DISP SURG SMS 44X23IN (PROTECTIVE PRODUCTS/GARMENTS) ×2 IMPLANT
DRAPE FNFLD ABS REINF 77X53IN_43528 PRXM LF STRL DISP SURG (PROTECTIVE PRODUCTS/GARMENTS) ×2
DRAPE SPLT FENESTRATE ABS REINF 108X77IN PRXM LF  STRL DISP SURG SMS 36X29IN BLU (PROTECTIVE PRODUCTS/GARMENTS) ×2 IMPLANT
DRAPE SPLT FENESTRATE ABS REIN_F 108X77IN PRXM LF STRL DISP (PROTECTIVE PRODUCTS/GARMENTS) ×2
ELECTRODE PATIENT RTN 9FT VLAB C30- LB RM PHSV ACRL FOAM CORD NONIRRITATE NONSENSITIZE ADH STRP (CAUTERY SUPPLIES) ×1 IMPLANT
ELECTRODE PATIENT RTN 9FT VLAB_REM C30- LB PLHSV ACRL FOAM (CAUTERY SUPPLIES) ×1
FILLER BONE VOID 1CC DBX ALLOGRAFT FRZDR PUTTY (TISSUE/PREPARE) ×2 IMPLANT
GARMENT COMPRESS MED CALF CENTAURA NYL VASOGRAD LTWT BRTHBL SEQ FIL BLU 18- IN (ORTHOPEDICS (NOT IMPLANTS)) ×1 IMPLANT
GARMENT COMPRESS MED CALF CENT_AURA NYL VASOGRAD LTWT BRTHBL (ORTHOPEDICS (NOT IMPLANTS)) ×1
GOWN SURG LRG AAMI L3 NONREINF_ORCE SET IN SLEEVE HKLP CLSR (DGOW) ×2
GOWN SURG LRG L3 NONREINFORCE HKLP CLSR SET IN SLEEVE STRL LF  DISP BLU SIRUS SMS 43IN (DGOW) ×2 IMPLANT
HANDLE RIGID PLASTIC STRL LF  DISP DVN EZ HNDL SURG LIGHT (INSTRUMENTS) ×1
HANDLE RIGID PLASTIC STRL LF_DISP DVN EZ HNDL SURG LIGHT (INSTRUMENTS) ×1
KIT RM TURNOVER CLEANOP CSTM INFCT CONTROL (KITS & TRAYS (DISPOSABLE)) ×1
KIT RM TURNOVER CLEANOP CSTM I_NFCT CONTROL (KITS & TRAYS (DISPOSABLE)) ×1
KIT RM TURNOVER CLEANOP CUSTOM INFCT CONTROL (KITS & TRAYS (DISPOSABLE)) ×1 IMPLANT
LABEL E-Z STICK_STLEZP1 100EA/CS (LABELS/CHART SUPPLIES) ×1
LABEL MED EZ PEEL MRKR LF (LABELS/CHART SUPPLIES) ×1 IMPLANT
LOOP VESSEL MAXI 457X1MM LF  RD BAND PCH RADOPQ SIL RCHRD-ALLAN DISP STRL (WOUND CARE SUPPLY) ×1 IMPLANT
LOOP VESSEL MAXI 457X1MM LF R_D BAND PCH RADOPQ SIL (WOUND CARE/ENTEROSTOMAL SUPPLY) ×1
Lordotic implant 6mm ×1 IMPLANT
Lordotic implant 7mm ×1 IMPLANT
MBO USE ITEM 317672 - HANDLE RIGID PLASTIC STRL LF  DISP DVN EZ HNDL SURG LIGHT (SURGICAL INSTRUMENTS) ×1 IMPLANT
NEEDLE SPINAL PNK 3.5IN 18GA QUINCKE REG WL POLYPROP QUINCKE TIP STRL LF  DISP (ANETHESIA SUPPLIES) ×1 IMPLANT
NEEDLE SPINAL PNK 3.5IN 18GA Q_UINCKE STD LGTH BVL FIT STY (ANETHESIA SUPPLIES) ×1
PACK NEURO LAD DYNJ49141B 5/CS (CUSTOM TRAYS & PACK) ×2 IMPLANT
PAD ARMBOARD FOAM BLU_FP-ECARM (POSITIONING PRODUCTS) ×3
PAD ARMBRD BLU (POSITIONING PRODUCTS) ×3 IMPLANT
PEN SURG MRKNG WRITESITE + RLR LBL SET 3X DARKER FORMULATE GNTN VIOL INK STRL LF  CHLRPRP (MISCELLANEOUS PT CARE ITEMS) ×1 IMPLANT
PEN SURG MRKNG WRITESITE + SKN_RLR LBL SET 3X DARKER (MISCELLANEOUS PT CARE ITEMS) ×1
RESERVOIR DRAIN SIL JP BULB 100CC STRL LF  DISP (WOUND CARE SUPPLY) ×1 IMPLANT
RESERVOIR DRAIN SIL JP BULB 10_0CC STRL LF DISP (WOUND CARE/ENTEROSTOMAL SUPPLY) ×1
SCREW DISTRACT 14MM SHARP TIP SLF TAP PIN THREAD SS SPINE CERV ANT  UNIV BLT STRL DISP (ORTHOPEDICS (NOT IMPLANTS)) ×2 IMPLANT
SCREW DISTRACT 14MM SHARP TIP_SLF TAP THREAD PIN UNIV BLT (ORTHOPEDICS (NOT IMPLANTS)) ×2
STRIP 4X.5IN STRSTRP PLSTR REINF SKNCLS WHT STRL LF (WOUND CARE SUPPLY) ×1 IMPLANT
SYRINGE FLUSH SAL PRSV FR PREF_L 10ML LL NDLS IV ACCESS SYS (NEEDLES & SYRINGE SUPPLIES) ×2
SYRINGE HYPO 10CC LL 309604 100/BX (Syringes w/ o Needles) ×2 IMPLANT
TRAY CATH 16FR BARDEX LUBRICATH STATLK SAF-FLOW FOLEY URMTR ADV NATURAL RUB DDRGL 350ML STRL LTX (TRAY) ×1 IMPLANT
TRAY CATH 16FR BARDEX LUBRICAT_H STATLK SAF-FLOW FOLEY URMTR (TRAY) ×1

## 2014-08-28 NOTE — OR PostOp (Signed)
Tolerated couple chips of ice. Denies pain. Slight bruising to anterior neck incision and ice applied. No edema at site.

## 2014-08-28 NOTE — OR Surgeon (Addendum)
[place]                                                     BRIEF OPERATIVE NOTE    Patient Name: [facility] Number: [id number]  Date of Service: 08/28/2014   Date of Birth: [birth year]    All elements must be documented.    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, C6-7   Post-Operative Diagnosis: Same  Procedure(s)/Description:  ACDF C5-6, C6-7  Findings/Complexity (inherent to the procedure performed): See dictated note     Attending Surgeon: Skeet Simmer  Assistant(s): Glade Stanford    Anesthesia Type: General endotracheal anesthesia  Estimated Blood Loss:  less than 50 ml  Blood Given: none  Fluids Given: crystalloid  Complications (not routinely expected or not inherent to difficulty/nature of procedure):none  Characteristic Event (routinely expected or inherent to the difficulty/nature of the procedure): none  Did the use of current and/or prior Anticoagulants impact the outcome of the case? no  Wound Class: Clean Wound: Uninfected operative wounds in which no inflammation occurred    Tubes: None  Drains: None  Specimens/ Cultures: none  Implants: Depuy, standalone x2, 80mm and 40mm           Disposition: PACU - hemodynamically stable.  Condition: stable      Greg Cutter, MD  [facility] Department of Neurosurgery  PGY-6        Late entry for 08/28/2014. I saw and examined the patient.  I reviewed the resident's note.  I agree with the findings and plan of care as documented in the resident's note.  Any exceptions/additions are edited/noted.    Lindie Spruce, MD

## 2014-08-28 NOTE — H&P (Addendum)
[facility]  Neurosurgery   Admission History and Physical      Ian Ellis, Ian Ellis, 64 y.o. male  Date of Admission:  08/28/2014  Date of Birth:  July 14, 1950  Referring Physician:  Call, Reymundo Poll, MD    Information Obtained from:patient and history reviewed via medical record  Chief Complaint: Neck and right arm pain    HPI:  Ian Ellis is a 64 y.o. male who presents for C5-7 ACDF for C5-6 retrolisthesis and cervical spondylosis at C6-7 with neural foraminal stenosis.     Patient presented with complaints of neck and right arm pain for one year as well as back pain for many years. He has a history of an L4-5 MIS decompression in the past with good relief of his previous radiating leg pain. Patient underwent ESI and PT which gave him good relief for 3 months until his pain returned.    Patient denies dyspnea, chest pain, recent illness or hospitalizations.    Admission Source:  Home  Advance Directives:  None-Discussed  Hospice involvement prior to admission?  Not applicable    ROS: (MUST comment on all "Abnormal" findings)  Other than ROS in the HPI, all other systems were negative.    PAST MEDICAL/ FAMILY/ SOCIAL HISTORY:   Past Medical History   Diagnosis Date    Anxiety     Essential hypertension     Hyperlipidemia     BCC (basal cell carcinoma of skin)      torso, nose removed in office     Medications Prior to Admission     Prescriptions    amLODIPine (NORVASC) 5 mg Oral Tablet    take 5 mg by mouth Once a day.      atorvastatin (LIPITOR) 20 mg Oral Tablet    Take 20 mg by mouth Every evening    escitalopram oxalate (LEXAPRO) 10 mg Oral Tablet    Take 20 mg by mouth Once a day     L. ACIDOPHILUS/L. RHAMNOSUS (PROBIOTIC ORAL)    Take by mouth    melatonin 10 mg Oral Tablet    take 1 Tab by mouth Once a day.      MULTI-VITAMIN ORAL    take 1 Tab by mouth Once a day.      PSYLLIUM HUSK (METAMUCIL ORAL)    take 1 Tab by mouth Once a day.      Trazodone 150 mg Oral Tablet Sustained Release  24 hr    Take 100 mg by mouth Once a day         Allergies   Allergen Reactions    Amoxicillin Anaphylaxis and Hives/ Urticaria     Past Surgical History   Procedure Laterality Date    Hx lumbar spine surgery  2007    Hx tonsillectomy  childhood    Hx hip replacement Right 2014    Colonoscopy      Hx wisdom teeth extraction       History   Substance Use Topics    Smoking status: Former Smoker -- 1.00 packs/day for 10 years     Types: Cigars     Quit date: 01/23/1983    Smokeless tobacco: Never Used      Comment: occasional cigar    Alcohol Use: No     Family History   Problem Relation Age of Onset    Alzheimer's/Dementia Mother     Emphysema Father      PHYSICAL EXAMINATION: (MUST comment on all "Abnormal" findings)  Glascow coma scale: 4/6/5    Constitutional  BP 155/80 mmHg   Pulse 43   Temp(Src) 36.2 C (97.2 F)   Resp 10   Ht 1.753 m (5\' 9" )   Wt 71 kg (156 lb 8.4 oz)   BMI 23.10 kg/m2   SpO2 97%  General appearance: NAD  HENT  Head: Normocephalic, atraumatic  Eyes: PERRL, EOMI, conjunctiva clear, optic discs and posterior segments- difficult to visualize, no obvious abnormalities  Throat: Tongue midline, trachea midline  Cardiovascular  Auscultation: RRR  Carotid arteries: Negative for carotid bruits  PV: No peripheral edema, peripheral pulses 2+  Respiratory: CTA bilaterally, unlabored respirations  Abdomen: Bowel sounds present, no tenderness  Musculoskeletal:   No evidence of obvious deformity   No involuntary movements   Muscle strength (upper extremities): 5/5 bilaterally  Muscle strength (lower extremities): 5/5 bilaterally   Muscle tone (upper extremities): Normal  Muscle tone (lower extremities): Normal  Neurological   Gait and station: Not observed  Sensation: Grossly intact throughout  Cerebellar: Rapid alternating movements and finger to nose intact. Romberg- maintains balance  No drift  DTR's:    RJ BJ TJ KJ AJ Hoffman's   Right 3+ 3+ 3+ 3+ 1+ Positive   Left 3+ 3+ 3+ 3+ 1+ Not present    Ankle clonus negative  Mental Status Exam  Orientation: Alert and oriented x3  Recent and remote memory: Intact  Attention span and concentration: Normal  Language: Normal  Fund of knowledge: Normal  Cranial Nerves  CN 2: PERRL  CN 3,4,6: EOMI, no nystagmus  CN 5: Facial sensation intact, corneal reflex present bilaterally  CN 7: No facial asymmetry  CN 8: Hearing intact  CN 9,10: Palate symmetric, gag reflex intact  CN 11: Normal shoulder shrug  CN 12: Tongue midline    Labs Ordered/ Reviewed : (Please indicate ordered or reviewed)  Reviewed: Labs:  PAT reviewed     Nimodipine Treatment Administered:  Not appropriate not an aneurysmal SAH    ASSESSMENT/Plan:  Ian Ellis is a 64 y.o. male presenting for C5-7 ACDF for C5-6 retrolisthesis and cervical spondylosis at C6-7 with neural foraminal stenosis.   - Patient continues to be an appropriate candidate for the planned procedure  - PAT reviewed  - Patient to be marked by Dr. Skeet Simmer  - Antibiotics ordered    Current Comorbid Conditions - Neurosurgery H&P  Loss of Consciousness:  no  Cerebral Edema:  no  Cerebral Hemorrhage: Not applicable  Brain Compression:  no  Craniectomy:  no  Obstructive Hydrocephalus:  no  Coma -Not applicable  Seizure-Not applicable   Respiratory Failure/Other-Not applicable  Coagulopathy Not applicable    DNR Status this admission:  Full Code  DVT/PE Prophylaxis: SCDs/ Ardath Sax, PA-C        Late entry for 08/28/2014. I personally saw and evaluated the patient. See mid-level's note for additional details. My findings/participation are noted    Lindie Spruce, MD

## 2014-08-28 NOTE — OR PreOp (Signed)
Patient arrived to preop bed 29 accompanied by wife. Assessment complete, questions addressed, vss.

## 2014-08-28 NOTE — OR PostOp (Signed)
Wife to meet patient at patient elevator to accompany patient to xray then to 9e. Patient alert and oriented, no pain.

## 2014-08-28 NOTE — Nurses Notes (Signed)
2115 Patient arrived to bed 965. Ambulated to bed. Complaining of incisional pain on anterior neck. Spouse at bedside.

## 2014-08-28 NOTE — Progress Notes (Addendum)
[facility]                                        Neurosurgery Post Operative Note      Patient Name: Sylvestre Rathgeber is a 64 y.o. male  Hospital Number: [id number]  Date of Service: 08/28/2014  Date of Birth: 03/16/1950      Procedure: ACDF C5-6 C6-7    Subjective: No acute complications    Objective: Blood pressure 155/80, pulse 43, temperature 36.2 C (97.2 F), resp. rate 12, height 1.753 m (5\' 9" ), weight 71 kg (156 lb 8.4 oz), SpO2 97 %.    Exam:  Awake  Alert  MAE  Fc x4    Assessment and Plan:   Charlotte Brafford is a 64 y.o. male status post ACDF C5-6, C6-7    -Clears  -OOB  -Abx x24 hrs  -Pain control  -D/c home tmr  -XR cervical    Greg Cutter, MD  [facility] Department of Neurosurgery  PGY-6      Late entry for 08/28/2014. I saw and examined the patient.  I reviewed the resident's note.  I agree with the findings and plan of care as documented in the resident's note.  Any exceptions/additions are edited/noted.    Lindie Spruce, MD

## 2014-08-28 NOTE — Care Plan (Signed)

## 2014-08-28 NOTE — Anesthesia Transfer of Care (Cosign Needed)
ANESTHESIA TRANSFER OF CARE NOTE                Last Vitals:     See doc flowsheet  Patient transferred to PACU in stable condition. Report given to RN.    8/19/2016at 19:25.

## 2014-08-28 NOTE — Anesthesia Postprocedure Evaluation (Signed)
ANESTHESIA POSTOP EVALUATION NOTE         08/28/2014     Last Vitals: Temperature: 37 C (98.6 F) (08/28/14 2030)  Heart Rate: 57 (08/28/14 2030)  BP (Non-Invasive): 131/68 mmHg (08/28/14 2030)  Respiratory Rate: 15 (08/28/14 2030)  SpO2-1: 92 % (08/28/14 2030)  Pain Score (Numeric, Faces): 7 (08/28/14 2129)    Procedures:   DISCECTOMY SPINE CERVICAL ANTERIOR MICRO WITH FUSION AND INSTRUMENTATION 2 LEVELS (N/A Spine Cervical)    Patient is sufficiently recovered from the effects of anesthesia to participate in the evaluation and has returned to their pre-procedure level.  I have reviewed and evaluated the following:  Respiratory Function: Consistent with pre anesthetic level  Cardiovascular Function: Consistent with pre anesthetic level  Mental Status: Return to pre anesthetic baseline level  Pain: Sufficiently controlled with medication  Nausea and Vomiting: Absent or sufficiently controlled with medication  Post-op Anesthetic Complications: None    Comment/ re-evaluation for any variations: None

## 2014-08-29 MED ORDER — VANCOMYCIN 1 GRAM/200 ML IN DEXTROSE 5 % INTRAVENOUS PIGGYBACK
15.0000 mg/kg | INJECTION | Freq: Two times a day (BID) | INTRAVENOUS | Status: AC
Start: 2014-08-29 — End: 2014-08-29
  Administered 2014-08-29: 1000 mg via INTRAVENOUS
  Administered 2014-08-29: 0 mg via INTRAVENOUS
  Administered 2014-08-29: 1000 mg via INTRAVENOUS
  Filled 2014-08-29 (×2): qty 200

## 2014-08-29 MED ORDER — CYCLOBENZAPRINE 10 MG TABLET
10.00 mg | ORAL_TABLET | Freq: Three times a day (TID) | ORAL | Status: AC
Start: 2014-08-29 — End: ?

## 2014-08-29 MED ORDER — OXYCODONE-ACETAMINOPHEN 5 MG-325 MG TABLET
1.00 | ORAL_TABLET | ORAL | Status: AC | PRN
Start: 2014-08-29 — End: ?

## 2014-08-29 MED ORDER — DOCUSATE SODIUM 100 MG CAPSULE
100.00 mg | ORAL_CAPSULE | Freq: Two times a day (BID) | ORAL | Status: AC
Start: 2014-08-29 — End: ?

## 2014-08-29 MED ADMIN — sennosides 8.6 mg-docusate sodium 50 mg tablet: ORAL | @ 08:00:00

## 2014-08-29 NOTE — Progress Notes (Addendum)
[facility]  NEUROSURGERY   PROGRESS NOTE      Ian Ellis, Ian Ellis, 64 y.o. male  Date of Admission:  08/28/2014  Date of Service: 08/29/2014  Date of Birth:  24-Jul-1950    Referring Physician:  Call, Reymundo Poll, MD    Post Op Day: 1 Day Post-Op S/P Procedure(s) (LRB):  DISCECTOMY SPINE CERVICAL ANTERIOR MICRO WITH FUSION AND INSTRUMENTATION 2 LEVELS (N/A)    Chief Complaint: ACDF C5-6 C6-7  Subjective: No acute events overnight.     Vital Signs:  Temp (24hrs) Max:37 C (37.1 F)      Systolic (06YIR), SWN:462 mmHg, Min:129 mmHg, VOJ:500 mmHg    Diastolic (93GHW), EXH:37 mmHg, Min:62 mmHg, Max:80 mmHg    Temp  Avg: 36.8 C (98.2 F)  Min: 36.2 C (97.2 F)  Max: 37 C (98.6 F)  Pulse  Avg: 57.8  Min: 43  Max: 67  Resp  Avg: 13.8  Min: 10  Max: 16  SpO2  Avg: 94.6 %  Min: 92 %  Max: 97 %  MAP (Non-Invasive)  Avg: 81.6 mmHG  Min: 78 mmHG  Max: 88 mmHG  Pain Score (Numeric, Faces): 0    Min/Max/Avg ICP/CPP last 24hrs:   No Data Recorded    Today's Physical Exam:  Appears stated age, NAD  A&Ox3  GCS _0 Fluent speech  CN 2 PERRL  CN _1 EOMI  CN 7 Face symmetric  CN 8 Hearing grossly intact  CN 11 shrug symmetric  CN 12 Tongue midline  Muscle Strength 5/5 BUE and BLE  Muscle tone WNL  No hoffman   No clonus  Incision CDI dermabond    Current Medications:    Current Facility-Administered Medications:  acetaminophen (TYLENOL) tablet 650 mg Oral Q6H PRN   aluminum-magnesium hydroxide-simethicone (MAALOX MAX) 400-400-68m per 573moral liquid 5 mL Oral Q4H PRN   amLODIPine (NORVASC) tablet 5 mg Oral Daily   atorvastatin (LIPITOR) tablet 20 mg Oral QPM   docusate sodium (COLACE) capsule 100 mg Oral 2x/day   escitalopram (LEXAPRO) tablet 20 mg Oral Daily   famotidine (PEPCID) tablet 20 mg Oral Q12H   hydrALAZINE (APRESOLINE) injection 10 mg 10 mg Intravenous Q4H PRN   NS flush syringe 2 mL Intracatheter Q8HRS   And      NS flush syringe 2-6 mL Intracatheter Q1 MIN PRN   ondansetron (ZOFRAN) 2 mg/mL injection 4  mg Intravenous Q6H PRN   oxyCODONE-acetaminophen (PERCOCET) 5-32556mer tablet 1 Tab Oral Q4H PRN   Or      oxyCODONE-acetaminophen (PERCOCET) 5-325m65mr tablet 2 Tab Oral Q4H PRN   sennosides-docusate sodium (SENOKOT-S) 8.6-50mg per tablet 1 Tab Oral Daily   traZODone (DESYREL) tablet 100 mg Oral NIGHTLY       I/O:  I/O last 24 hours:      Intake/Output Summary (Last 24 hours) at 08/29/14 0047  Last data filed at 08/29/14 0000   Gross per 24 hour   Intake   1365 ml   Output    700 ml   Net    665 ml     I/O current shift:  08/20 0000 - 08/20 0759  In: 75 [I.V.:75]  Out: 600 [Urine:600]    Nutrition/Residuals:  DIET REGULAR    Labs  Please indicate ordered or reviewed)  Reviewed: BMP:   No results found for this encounter  CBC Results Differential Results   No results found for this encounter No results found for this or  any previous visit (from the past 30 hour(s)).     ESR(automated)/C-protein: No results found for this encounter  Coags:  No results found for this encounter    Assessment/ Plan:  Active Hospital Problems    Diagnosis    Primary Problem: ACDF C5-6, C6-7     Ian Ellis is a 64 y.o. male s/p ACDF C5-6 C6-7 POD 0.  -- Imaging: XR demonstrates expected post op changes  -- Pain/spasm control: tylenol PRN, percocet PRN  -- Diet: regular  -- Bowel regimen, last BM PTA  -- Abx: vanco periop  -- Activity: OOB as tolerated  -- PT/OT: ordered  -- DVT ppx: SCDs    Likely DC home today.    Sharolyn Douglas MD  PGY-3 Neurosurgery  08/29/2014 00:47          Late entry for 08/29/2014. I saw and examined the patient.  I reviewed the resident's note.  I agree with the findings and plan of care as documented in the resident's note.  Any exceptions/additions are edited/noted.    Lindie Spruce, MD

## 2014-08-29 NOTE — OR Surgeon (Signed)
[place] OF NEUROSURGERY                                     OPERATION SUMMARY    PATIENT NAME: Ian Ellis, [facility] QQIWLN:[id number]  DATE OF SERVICE:08/28/2014  DATE OF BIRTH: 1950/09/26    PREOPERATIVE DIAGNOSES:  1.  Cervical spondylosis C5-C6, C6-C7.  2.  Cervical radiculopathy.  3.  Neck pain.    POSTOPERATIVE DIAGNOSES:  1.  Cervical spondylosis C5-C6, C6-C7.  2.  Cervical radiculopathy.  3.  Neck pain.    NAME OF PROCEDURES:  1.  Anterior cervical diskectomy and arthrodesis at C5-C6 and C6-C7 using the 0 PDA system packed with allograft bone matrix.  2.  Use of anterior cervical integrated plating system attached to the 0 PDA graft titanium, affixed with divergence 14 and 16-mm screws, C5-C6 and C6-C7.  3.  Use of Biosynthetic polyether ether ketone intervertebral cages, 0 PDA type, 6 mm large at C5-C6, 7 mm large at C6-C7.  4.  Use of allograft cadaveric demineralized bone matrix for intervertebral arthrodesis C5-C6, C6-C7.    SURGEON:  Lamar Benes, MD.    ASSISTANT:  Greg Cutter, MD.    DRAINS:  None.    ESTIMATED BLOOD LOSS:  50 mL.    COMPLICATIONS:  None.    INDICATION FOR PROCEDURE:  [name] is a very pleasant elderly gentleman who presented in neurosurgery clinic with neck pain and significant cervical radiculopathy bilaterally.  After failure of medical management including physical therapy, NSAIDs and muscle relaxants, imaging revealed that he had significant cervical spondylosis, posterior osteophyte and neural foraminal stenosis compression.  Based on his failure of medical management and his imaging, he is brought to the operative suite for intervention.    DESCRIPTION OF PROCEDURE:  Prior to the procedure, a timeout was completed.  His allergies were reviewed.  He was given weight-based clindamycin given his allergies, 10 mg of IV Decadron, intubated and placed in supine position  on the operative table, and the right side of his neck was prepped with alcohol and chlorhexidine, and he was draped in sterile fashion.  Fluoroscopy was used to localize the C5 through C7 space, and approximately 10 mL of 1% lidocaine with epinephrine was injected in the right side of the neck.  We used a 10 blade scalpel to make a curvilinear incision, and then we divided the platysma in vertical fashion.  We dissected medial to the sternocleidomastoid, common carotid and internal jugular until the spine was intersected.  We placed medial-lateral [employer] retractors, and then we used a 15 blade scalpel to incise the disk.  We distracted off the Caspar pins and then used a Matrix drill bit to remove the disk, the posterior osteophytes and shaved the endplates in parallel fashion.  We attempted multiple trials and determined that a 7 mm large was most appropriate.  We packed it with allograft cadaveric demineralized bone matrix and then used a 1-mm Kerrison punch to resect the posterior longitudinal ligament and to perform the bilateral neural foraminotomies.  We inserted the graft at C5-C6 and used the 60-mm screw on the right side.  We then moved the retractor blades inferiorly, placed  a Caspar pin from C5 to C7 and performed the same decompression diskectomy.  We attempted multiple trials and determined that the 6-mm head was most appropriate.  We packed that with allograft cadaveric demineralized bone matrix and inserted that.  We affixed the titanium anterior plate integrated with the graft at the C6-C7 level using 14-mm screws and then went back and then did the left C5-C6 screw with 14 mm.  We then liberally irrigated with bacitracin solution, cauterized all bleeding edges, removed the Caspar pins and removed the retractor blades.  We then reapproximated the platysma with 3-0 interrupted Vicryl and then reapproximated the skin with a running 4-0 V-Loc stitch.  Dermabond was placed on the skin surface.   The patient was then extubated and brought to the PACU for continued evaluation and management.  I was present for all portions of the procedure from beginning to end.      Lamar Benes, MD  Assistant Professor  [facility] Department of Neurosurgery    [id number]; D: 08/28/2014 19:38:37; T: 08/29/2014 23:30:33

## 2014-08-29 NOTE — Care Plan (Signed)
Problem: General Plan of Care(Adult,OB)  Goal: Plan of Care Review(Adult,OB)  The patient and/or their representative will communicate an understanding of their plan of care   Outcome: Ongoing (see interventions/notes)  Patient underwent C5-C7 ACDF. Incision WDL. Complaining of moderate pain in throat and infrequent cough. PRN percocet administered and soothing liquids. IV Vancomycin administered. Possible D/C this afternoon.     Problem: Perioperative Period (Adult)  Prevent and manage potential problems including:1. bleeding2. gastrointestinal complications3. hypothermia4. infection5. pain6. perioperative injury7. respiratory compromise8. situational response9. urinary retention10. venous [id number]. wound complications   Goal: Signs and Symptoms of Listed Potential Problems Will be Absent or Manageable (Perioperative Period)  Signs and symptoms of listed potential problems will be absent or manageable by discharge/transition of care (reference Perioperative Period (Adult) CPG).   Outcome: Ongoing (see interventions/notes)    Problem: Pain, Acute (Adult)  Goal: Identify Related Risk Factors and Signs and Symptoms  Related risk factors and signs and symptoms are identified upon initiation of Human Response Clinical Practice Guideline (CPG)  Outcome: Ongoing (see interventions/notes)  Goal: Acceptable Pain Control/Comfort Level  Patient will demonstrate the desired outcomes by discharge/transition of care.  Outcome: Ongoing (see interventions/notes)

## 2014-08-29 NOTE — Nurses Notes (Signed)
Patient discharged to home with family. Discharge teaching completed. PIV will be removed when last vanc dose is finished. Reviewed AVS and provided information for patient to schedule follow up appointments.

## 2014-09-04 NOTE — Discharge Summary (Addendum)
DISCHARGE SUMMARY      PATIENT NAME:  Ian Ellis, Ian Ellis  MRN:  [id number]  DOB:  Mar 16, 1950    ADMISSION DATE:  08/28/2014  DISCHARGE DATE:  08/29/2014    ATTENDING PHYSICIAN: Lindie Spruce, MD, PhD  PRIMARY CARE PHYSICIAN: Reymundo Poll Call, MD     Reason for Admission     Diagnosis        Cervical stenosis of spine [213626]          DISCHARGE DIAGNOSIS:   Principle Problem: Cervical stenosis of spine  Active Hospital Problems    Diagnosis Date Noted    Principle Problem: ACDF C5-6, C6-7 08/28/2014      Resolved Hospital Problems    Diagnosis    No resolved problems to display.     Active Non-Hospital Problems    Diagnosis Date Noted    Hx of pancreatitis 08/12/2014    Pneumonia 01/23/2011    Hyperlipidemia 04/14/1993      Allergies   Allergen Reactions    Amoxicillin Anaphylaxis and Hives/ Urticaria     DISCHARGE MEDICATIONS:     Current Discharge Medication List      START taking these medications.       Details    cyclobenzaprine 10 mg Tablet   Commonly known as:  FLEXERIL    10 mg, Oral, 3 TIMES DAILY   Qty:  90 Tab   Refills:  0       docusate sodium 100 mg Capsule   Commonly known as:  COLACE    100 mg, Oral, 2 TIMES DAILY   Qty:  60 Cap   Refills:  0       oxyCODONE-acetaminophen 5-325 mg Tablet   Commonly known as:  PERCOCET    1 Tab, Oral, EVERY 4 HOURS PRN   Qty:  60 Tab   Refills:  0         CONTINUE these medications - NO CHANGES were made during your visit.       Details    amLODIPine 5 mg Tablet   Commonly known as:  NORVASC    5 mg, Oral, DAILY,     Refills:  0       atorvastatin 20 mg Tablet   Commonly known as:  LIPITOR    20 mg, Oral, EVERY EVENING   Refills:  0       escitalopram oxalate 10 mg Tablet   Commonly known as:  LEXAPRO    20 mg, Oral, DAILY   Refills:  0       melatonin 10 mg Tablet    1 Tab, Oral, DAILY,     Refills:  0       METAMUCIL ORAL    1 Tab, Oral, DAILY,     Refills:  0       MULTI-VITAMIN ORAL    1 Tab, Oral, DAILY,     Refills:  0       PROBIOTIC ORAL    Oral   Refills:   0       Trazodone 150 mg Tablet Sustained Release 24 hr    100 mg, Oral, DAILY   Refills:  0           Continue these medications as prescribed until followup with monitoring provider unless otherwise stated above.  DISCHARGE INSTRUCTIONS:        Follow-up Information     [facility] with [facility] .    Specialty:  Neurosurgery  Contact information:    [place]  [phone]    Additional information:    For driving directions to the [facility] in [place], [place], please call [phone]). You may also visit our website at [email or web address]*Valet parking is available to patients at [place] outpatient clinics for free and tipping is not required.*Visitors to our main campus will Location manager as we are expanding to better serve you. We apologize for any inconvenience this may cause and appreciate your patience.          [facility] CERVICAL SPINE [facility] AND LATERAL   Ordering Provider CHEYUO, CLETUS [phone]      DISCHARGE INSTRUCTION - DIET   Diet: RESUME HOME DIET      DISCHARGE INSTRUCTION - ACTIVITY   NO LIFTING OVER 5 POUNDS  NO DRIVING WHILE ON PAIN MEDICATIONS OR UNTIL CLEARED IN CLINIC   Activity: NO LIFTING OVER 5 POUNDS    Activity: NO DRIVING WHILE TAKING NARCOTICS      DISCHARGE INSTRUCTION - INCISION/WOUND CARE   LEAVE INCISION OPEN TO AIR  MAY REMOVE DRESSING AND SHOWER   Instructions for incision/wound care: LEAVE INCISION OPEN TO AIR    Instructions for incision/wound care: MAY SHOWER, NO TUB BATHS OR SWIMMING      DISCHARGE INSTRUCTION - MISC   NO ASPIRIN OR NSAID'S UNTIL CLEARED IN CLINIC  CALL WITH QUESTIONS, COME TO ED OR CLINIC WITH HIGH FEVERS, MENTAL STATUS CHANGES OR PROBLEMS WITH INCISION     SCHEDULE FOLLOW-UP NEUROSURGERY - PHYSICIAN OFFICE CENTER   Follow-up in: 4 WEEKS    Reason for visit: POST-OP VISIT    Followup reason: C5-7 ACDF    Provider: [place] COURSE:   This is a 64 y.o., male who presented in neurosurgery clinic with neck pain and significant cervical radiculopathy bilaterally. After failure of medical management including physical therapy, NSAIDs and muscle relaxants, imaging revealed that he had significant cervical spondylosis, posterior osteophyte and neural foraminal stenosis compression. Based on his failure of medical management and his imaging, he was offered surgery. He underwent C5-C6 and C6-C7 ACDF without complications and had an uneventful postop.  The patient was determined safe for discharge on 08/29/2014.  Prior to discharge the patient was tolerating a diet, his pain was well controlled on oral pain medications, and he was ambulating well and neurosurgery felt he was safe for discharge home.  The patient will follow up with Neurosurgery in 4 weeks for reevaluation.  All questions were answered prior to discharge and the patient agreed to be discharged at this time. The patient was instructed to follow up sooner for new or concerning symptoms.        CONDITION ON DISCHARGE:  A. Ambulation: Full ambulation  B. Self-care Ability: Complete  C. Cognitive Status Alert and Oriented x 3  D. DNR status at discharge: Full Code    Advance Directive Information       Most Recent Value    Does the Patient have an Advance Directive? Yes, Patient Does Have Advance Directive for Healthcare Treatment          DISCHARGE DISPOSITION:  Home discharge    NEUROSURGERY RISK FACTORS:  -None of the following conditions apply      Hipolito Bayley, MD PHD        Copies sent to Care Team       Relationship Specialty  Notifications Start End    Call, Reymundo Poll, MD PCP - General EXTERNAL  06/04/14     Phone: [phone] Fax: [phone]         [address] [place] [address]    Hodder, [place], DO Pulmonologist Pulmonary Disease Admissions 05/29/11     Phone: [phone] Fax: [phone]         [address] [place] [address]          Referring providers can  utilize [email or web address] to access their referred [facility] patient's information or call [phone] to contact this provider.      Late entry for 08/29/2014. I saw and examined the patient.  I reviewed the resident's note.  I agree with the findings and plan of care as documented in the resident's note.  Any exceptions/additions are edited/noted.    Lindie Spruce, MD

## 2014-09-22 NOTE — Addendum Note (Signed)
Addendum  created 09/22/14 1302 by Rupert Stacks, MD    Modules edited: Anesthesia Attestations

## 2014-09-24 ENCOUNTER — Encounter (INDEPENDENT_AMBULATORY_CARE_PROVIDER_SITE_OTHER): Payer: Self-pay | Admitting: Neurological Surgery

## 2014-09-24 ENCOUNTER — Ambulatory Visit
Admission: RE | Admit: 2014-09-24 | Discharge: 2014-09-24 | Disposition: A | Discharge status: Home / Self Care | Payer: 59 | Source: Ambulatory Visit | Attending: Neurological Surgery | Admitting: Neurological Surgery

## 2014-09-24 ENCOUNTER — Ambulatory Visit (HOSPITAL_BASED_OUTPATIENT_CLINIC_OR_DEPARTMENT_OTHER): Payer: 59 | Admitting: Neurological Surgery

## 2014-09-24 VITALS — BP 142/74 | HR 50 | Temp 96.0°F | Ht 69.06 in | Wt 162.3 lb

## 2014-09-24 DIAGNOSIS — Z981 Arthrodesis status: Secondary | ICD-10-CM | POA: Insufficient documentation

## 2014-09-24 DIAGNOSIS — M4312 Spondylolisthesis, cervical region: Secondary | ICD-10-CM

## 2014-09-24 DIAGNOSIS — R52 Pain, unspecified: Secondary | ICD-10-CM | POA: Insufficient documentation

## 2014-09-24 DIAGNOSIS — Z4889 Encounter for other specified surgical aftercare: Secondary | ICD-10-CM | POA: Insufficient documentation

## 2014-09-24 DIAGNOSIS — M4802 Spinal stenosis, cervical region: Secondary | ICD-10-CM

## 2014-09-24 DIAGNOSIS — M5022 Other cervical disc displacement, mid-cervical region: Secondary | ICD-10-CM

## 2014-09-24 DIAGNOSIS — M549 Dorsalgia, unspecified: Secondary | ICD-10-CM | POA: Insufficient documentation

## 2014-09-24 DIAGNOSIS — M5032 Other cervical disc degeneration, mid-cervical region: Secondary | ICD-10-CM

## 2014-09-24 DIAGNOSIS — Z9889 Other specified postprocedural states: Secondary | ICD-10-CM

## 2014-09-24 NOTE — Progress Notes (Signed)
[facility]  Return Outpatient Note    Date:  09/24/2014  Age:  64 y.o.  Referring Physician:   Lindie Spruce, MD  [address] [place], [place] [address]    Chief Complaint:   Chief Complaint   Patient presents with    Follow-up After Testing     4 week f/u xrays       Subjective:   S/p ACDF C5-7 doing well. C/o pain in SCM.     Objective:   Vital Signs:  BP 142/74 mmHg   Pulse 50   Temp(Src) 35.6 C (96 F) (Tympanic)   Ht 1.754 m (5' 9.05")   Wt 73.6 kg (162 lb 4.1 oz)   BMI 23.92 kg/m2   SpO2 97%  CURRENT WEIGHT: @WTENG @  BMI: @BMI1 @  xray    Constitutional  Generally appearing stated age, not overweight, male, appropriate mood and affect.     NEUROLOGIC:  Alert and oriented x3.  Speech appropriate.  Cognition is intact. Memory intact. Attention span appropriate.    Cranial Nerves: Pupils equal and reactive to light, 3, 4, 6 good ocular motion.  Visual fields gross inspection intact. Facial sensation and motor intact.  No Nystagmus. Tongue midline.  Normal palate elevation.  Hearing intact to finger rub.  Eleven has good strength.   Motor:  Motor exam is 5/5 in all 4 extremities with exceptions:       Right   Left             Deltoids  Biceps  Triceps  Wrist Ext:  Wrist Flex  Grip  Hip Flex  Hip Ext  Quad  Hamstring  Plantar Flex.  Dorsal Flex.  EHL:  Foot eversion    Incision CDI      Discussions with other providers:     Assessment:  S/p ACDF C5-7 dpoing well. Pain in SCM expected. Swallowing well.   No diagnosis found.    Recommendations  1. F/U in 6 weke  2. Will assess back pain issues given L2-4 lumbar disk degenratoin  3. xrays stable and good appearance.       Lindie Spruce, MD 09/24/2014, 12:29

## 2014-10-29 ENCOUNTER — Ambulatory Visit: Payer: 59 | Attending: Neurological Surgery | Admitting: Neurological Surgery

## 2014-10-29 ENCOUNTER — Encounter (INDEPENDENT_AMBULATORY_CARE_PROVIDER_SITE_OTHER): Payer: Self-pay | Admitting: Neurological Surgery

## 2014-10-29 VITALS — BP 128/76 | HR 62 | Temp 97.5°F | Ht 68.9 in | Wt 163.1 lb

## 2014-10-29 DIAGNOSIS — M549 Dorsalgia, unspecified: Secondary | ICD-10-CM | POA: Insufficient documentation

## 2014-10-29 DIAGNOSIS — M4682 Other specified inflammatory spondylopathies, cervical region: Secondary | ICD-10-CM | POA: Insufficient documentation

## 2014-10-29 DIAGNOSIS — G8929 Other chronic pain: Secondary | ICD-10-CM | POA: Insufficient documentation

## 2014-10-29 DIAGNOSIS — M4692 Unspecified inflammatory spondylopathy, cervical region: Secondary | ICD-10-CM

## 2014-10-29 DIAGNOSIS — M5412 Radiculopathy, cervical region: Secondary | ICD-10-CM | POA: Insufficient documentation

## 2014-10-29 NOTE — Progress Notes (Signed)
I personally saw and evaluated the patient. See mid-level's note for additional details. My findings/participation are s/p surgery doing well without neck pain. PT. Endorses LBP.     FU after [place] and assess back pain.    Lindie Spruce, MD

## 2014-10-29 NOTE — Progress Notes (Addendum)
Date:  10/29/2014  Age:  64 y.o.  Referring Physician:   Lindie Spruce, MD  [place]  [place],  [id number]    Follow Up:    Subjective:   This is a 64 y.o left handed male with history of neck pain and significant cervical radiculopathy bilaterally. He is s/p C5-C6 and C6-C7 ACDF on 08/29/14. He is retired.Today, patient states doing well overall. Pain rated as a 1/10 described as a muscle stiffness in neck. Currently takes oxycodone for back pain, back to be evaluated at a later date. Patient denies nausea, vomiting, diarrhea, SOB and bowel or bladder dysfunction. No recent falls, seizures, blurry vision or headaches. Other pertinent ROS negative. No family present. No tobacco.    Objective:   Vital Signs:  BP 128/76 mmHg   Pulse 62   Temp(Src) 36.4 C (97.5 F)   Ht 1.75 m (5' 8.9")   Wt 74 kg (163 lb 2.3 oz)   BMI 24.16 kg/m2   SpO2 98%  General appearance: well groomed, appeared in no apparent distress.  HENT  Head: Normocephalic, atraumatic  Eyes: PERRL, EOMI, conjunctiva clear, optic discs and posterior segments- no obvious abnormalities  Throat: Tongue midline, trachea midline  Cardiovascular  Auscultation: RRR  Carotid arteries: Negative for carotid bruits  PV: peripheral pulses 2+  Respiratory: CTA bilaterally, unlabored respirations  Abdomen: Bowel sounds present, no tenderness  Musculoskeletal:   No evidence of obvious deformity   No involuntary movements   Muscle strength (upper extremities): 5/5 bilaterally  Muscle strength (lower extremities): 5/5 bilaterally   Muscle tone (upper extremities): Normal  Muscle tone (lower extremities): Normal  Neurological   Gait and station: WNL  Sensation:Intact   Cerebellar: Rapid alternating movements and finger to nose intact.  DTR's:    RJ BJ TJ KJ AJ  Hoffman's   Right 2+ 2+ 2+ 2+ 2+  Not present   Left 2+ 2+ 2+ 2+ 2+  Not present   Ankle clonus negative  Mental Status Exam  Orientation: Alert and oriented x3  Recent and remote memory:  Intact  Attention span and concentration: Normal  Language: Normal  Fund of knowledge: Normal  Cranial Nerves  CN 2: PERRL  CN 3,4,6: EOMI, no nystagmus  CN 5: Facial sensation intact  CN 7: No facial asymmetry  CN 8: Hearing intact  CN 9,10: Palate symmetric  CN 11: Normal shoulder shrug  CN 12: Tongue midline  Incision: well healed, no drainage, fevers, or erythema.    Data reviewed    None to review    Assessment: Per Dr. Skeet Simmer  I personally saw and evaluated the patient. See mid-level's note for additional details. My findings/participation are s/p surgery doing well without neck pain. PT. Endorses LBP.     FU after [place] and assess back pain.      ICD-10-CM    1. Cervical radiculopathy at C6 M54.12    2. 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    -Recommended to Follow up 6 months, earlier as needed for chronic back pain.    -Continue Medical Management (Diet, Exercise, Medication).    -The patient has been advised to follow up with their PCP in regards to any chronic medical conditions and any non-neurosurgical symptoms they may have.      The patient was seen as a co-visit with covering physician.  Quillian Quince  Law, APRN 10/29/2014, 09:32    With Dr. Skeet Simmer  Late entry for 10/29/14. I personally saw and evaluated the patient. See mid-level's note for additional details. My findings/participation are s/p ACDF doing well.     Lindie Spruce, MD

## 2014-12-09 ENCOUNTER — Ambulatory Visit (INDEPENDENT_AMBULATORY_CARE_PROVIDER_SITE_OTHER): Payer: Self-pay | Admitting: Neurological Surgery

## 2014-12-09 NOTE — Telephone Encounter (Addendum)
-----   Message from Myrtha Mantis sent at 12/08/2014 11:08 AM EST -----  >> TINA EDMONDS 12/08/2014 11:08 AM  Dr. Skeet Simmer pt  Patient called stating that he is getting ready to have a dental cleaning done, he is asking if he needs pre medicated due to having titanium in his neck. He said he will need something in writing and will need it mailed to him in [place] at [address] [place] [id number]. Please call him to advise first.    ------------------------------------------------------------------------------------------    Per Dr. Marjo Bicker doesn't require any pre-medication.  Hamilton Capri, LPN  [id number], [phone]

## 2014-12-09 NOTE — Patient Instructions (Signed)
To Whom This May Concern,    Ian Ellis was under the care of Dr. Lindie Spruce and the [facility] Department of Neurosurgery. Per Dr. Burt Ek recommendation from a Neurosurgical standpoint no pre- medication is required prior to dental cleaning or dental work. You may contact our office with any questions or concerns at [phone].     Hamilton Capri, LPN  [id number], [id number]

## 2015-04-29 ENCOUNTER — Encounter (INDEPENDENT_AMBULATORY_CARE_PROVIDER_SITE_OTHER): Payer: 59 | Admitting: Neurological Surgery

## 2015-06-09 ENCOUNTER — Ambulatory Visit: Payer: Medicare Other | Attending: Neurological Surgery | Admitting: Neurological Surgery

## 2015-06-09 ENCOUNTER — Encounter (INDEPENDENT_AMBULATORY_CARE_PROVIDER_SITE_OTHER): Payer: Self-pay | Admitting: Neurological Surgery

## 2015-06-09 VITALS — BP 130/68 | Temp 95.4°F | Ht 69.0 in | Wt 169.8 lb

## 2015-06-09 DIAGNOSIS — Z85820 Personal history of malignant melanoma of skin: Secondary | ICD-10-CM | POA: Insufficient documentation

## 2015-06-09 DIAGNOSIS — M545 Low back pain, unspecified: Secondary | ICD-10-CM

## 2015-06-09 DIAGNOSIS — Z85828 Personal history of other malignant neoplasm of skin: Secondary | ICD-10-CM | POA: Insufficient documentation

## 2015-06-09 DIAGNOSIS — M4726 Other spondylosis with radiculopathy, lumbar region: Secondary | ICD-10-CM | POA: Insufficient documentation

## 2015-06-09 DIAGNOSIS — M47816 Spondylosis without myelopathy or radiculopathy, lumbar region: Secondary | ICD-10-CM

## 2015-06-09 DIAGNOSIS — M5416 Radiculopathy, lumbar region: Secondary | ICD-10-CM

## 2015-06-09 NOTE — Progress Notes (Signed)
[facility]  Return Outpatient Note    Date:  06/09/2015  Age:  65 y.o.  Referring Physician:   Call, Reymundo Poll, MD  [place], MD [id number]    Subjective: Nyzier Remmick is a 65 y.o. with a history of C5-6 and C6-7 ACDF 08/29/14 by Dr. Skeet Simmer for cervical spondylosis with bilateral cervical radiculopathy who is presenting for evaluation of chronic low back pain. Patient reports a long history of low back pain which he describes as a constant dull pain across the low back with occasional spasms and sharp pain with turning or bending movement. Pain travels down the posterior thighs intermittently, but main concern is his low back pain. Denies ntw or bowel or bladder issues. He underwent lumbar spine surgery (possibly at L4-5) in [place] in 2007 and reports no relief of his symptoms. Pain is improved with pain medication. He has had PT about 1 year ago for a 9 week period with no relief.     PMH is significant for HLD, HTN, skin cancer (melanoma and BCC), and anxiety. No tobacco use.    Objective:       Examination  Constitutional  BP 130/68   Temp 35.2 C (95.4 F) (Tympanic)    Ht 1.753 m (5\' 9" )   Wt 77 kg (169 lb 12.1 oz)   SpO2 97%   BMI 25.07 kg/m2  General appearance: NAD  HENT  Head: Normocephalic, atraumatic  Eyes: PERRL, EOMI, conjunctiva clear, optic discs and posterior segments- difficult to visualize, no obvious abnormalities  Throat: Tongue midline, trachea midline  Cardiovascular  Auscultation: RRR  PV: No peripheral edema  Respiratory: CTA bilaterally, unlabored respirations  Musculoskeletal:   No evidence of obvious deformity   + musculoskeletal palpation of the lumbar spine, worse on the left side  No involuntary movements   Muscle strength (upper extremities): 5/5 bilaterally  Muscle strength (lower extremities): 5/5 bilaterally   Muscle tone (upper extremities): Normal  Muscle tone (lower extremities): Normal  Neurological   Gait and station:  Normal  Sensation: Grossly intact throughout  Coordination: Normal  Reflexes:    RJ BJ TJ KJ AJ Hoffman's   Right 2+ 2+ 2+ 2+ 2+ Not present   Left 2+ 2+ 2+ 2+ 2+ Not present   Ankle clonus negative  Mental Status Exam  Orientation: Alert and oriented x3  Recent and remote memory: Intact  Attention span and concentration: Follows commands appropriately  Language: Fluent, appropriate  Fund of knowledge: Appropriate  Cranial Nerves  CN 2: PERRL  CN 3,4,6: EOMI, no nystagmus  CN 5: Facial sensation intact  CN 7: No facial asymmetry  CN 8: Hearing intact  CN 9,10: Palate symmetric  CN 11: Normal shoulder shrug  CN 12: Tongue midline  Incision: ACDF incision appears well healed with no erythema or drainage    Data reviewed with cosigning physician:  Lumbar MRI performed 05/26/14 reviewed on IG and shows L2-5 spondylosis.      Assessment:    ICD-10-CM    1. Lumbar spondylosis M47.816 AMB CONSULT/REFERRAL [facility]   2. Low back pain M54.5 AMB CONSULT/REFERRAL [facility]   3. Lumbar radicular pain M54.16 AMB CONSULT/REFERRAL [facility]       Treatment Plan  Patient's history, films, recommendations, and plan from today's visit were reviewed and discussed in detail with Liz Malady while in the clinic. Questions were answered and patient is in agreement with the current plan of  care as follows:   - No neurosurgical intervention recommended  - Referral to pain clinic for evaluation for ESI and facet rhizotomies  - Advised to continue to follow with PCP for chronic health concerns  - A copy of today's office visit report to be sent to PCP    Colonel Bald, PA-C  [facility]  06/09/2015, 09:36   Pager [phone]    With Dr. Skeet Simmer      Late entry for 06/09/2015. I personally saw and evaluated the patient. See mid-level's note for additional details. My findings/participation are Lumbar MRI performed 05/26/14 reviewed on IG and shows L2-5 spondylosis.Will continue with medical manangment as  above.     Lindie Spruce, MD

## 2015-06-15 ENCOUNTER — Ambulatory Visit: Payer: Medicare Other | Attending: Anesthesiology | Admitting: Family

## 2015-06-15 ENCOUNTER — Encounter (INDEPENDENT_AMBULATORY_CARE_PROVIDER_SITE_OTHER): Payer: Self-pay | Admitting: Family

## 2015-06-15 VITALS — BP 132/84 | HR 50 | Temp 97.9°F | Resp 20 | Ht 67.91 in | Wt 169.5 lb

## 2015-06-15 DIAGNOSIS — Z79899 Other long term (current) drug therapy: Secondary | ICD-10-CM | POA: Insufficient documentation

## 2015-06-15 DIAGNOSIS — I10 Essential (primary) hypertension: Secondary | ICD-10-CM | POA: Insufficient documentation

## 2015-06-15 DIAGNOSIS — Z87891 Personal history of nicotine dependence: Secondary | ICD-10-CM | POA: Insufficient documentation

## 2015-06-15 DIAGNOSIS — M549 Dorsalgia, unspecified: Secondary | ICD-10-CM

## 2015-06-15 DIAGNOSIS — Z79891 Long term (current) use of opiate analgesic: Secondary | ICD-10-CM | POA: Insufficient documentation

## 2015-06-15 DIAGNOSIS — M533 Sacrococcygeal disorders, not elsewhere classified: Secondary | ICD-10-CM | POA: Insufficient documentation

## 2015-06-15 DIAGNOSIS — M545 Low back pain, unspecified: Secondary | ICD-10-CM

## 2015-06-15 DIAGNOSIS — M5416 Radiculopathy, lumbar region: Secondary | ICD-10-CM

## 2015-06-15 DIAGNOSIS — E785 Hyperlipidemia, unspecified: Secondary | ICD-10-CM | POA: Insufficient documentation

## 2015-06-15 DIAGNOSIS — M47816 Spondylosis without myelopathy or radiculopathy, lumbar region: Principal | ICD-10-CM | POA: Insufficient documentation

## 2015-06-15 DIAGNOSIS — M4726 Other spondylosis with radiculopathy, lumbar region: Secondary | ICD-10-CM | POA: Insufficient documentation

## 2015-06-15 DIAGNOSIS — F419 Anxiety disorder, unspecified: Secondary | ICD-10-CM | POA: Insufficient documentation

## 2015-06-15 NOTE — Progress Notes (Signed)
Pain and Function: 4  Activity:0  Sleep: 6  Mood:2  Stress2:

## 2015-06-15 NOTE — Progress Notes (Signed)
[facility]                              [facility]  [place] [address]  [phone]         History and Physical      Date of Service: 06/15/2015   Name: Ian Ellis   Date of Birth: 09/12/1950       Obtained History From: the patient    CHIEF COMPLAINT:   Chief Complaint   Patient presents with    Back Pain       IMAGES: 05-26-14 MRI reviewed    HPI:  Ian Ellis  is a 65 y.o. , male who presents with low back pain. This is a chronic issue that has been present since 2007. There was no incident or trauma that caused the back pain. In 2006 he had a L4-L5 decompression for DDD.  Today he has 4/10 low back pain. The pain is worse on the left but sometimes present on the right. The pain is dull and aching.The pain radiates down to the posterior thigh. The pain never goes past the knee. Extension, rotation, activity, coughing, and sneezing aggravates the pain. No myelopathy, no urinary retention, loss of bowel or bladder. The pt is currently takes Percocet, flexeril ,and  Xanax for muscle spasms. He takes Diclofenac daily. These are prescribed by his PCP. He takes the Percocet BID-TID. He takes the Xanax 2-3 per week. The patient has tried physical therapy last year with no relief. The pt has not tried a Restaurant manager, fast food, massage, or acupuncture. The pt works out independently at [employer].    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      atorvastatin (LIPITOR) 20 mg Oral Tablet Take 20 mg by mouth Every evening    cyclobenzaprine (FLEXERIL) 10 mg Oral Tablet Take 1 Tab (10 mg total) by mouth Three times a day    diclofenac sodium (VOLTAREN) 75 mg Oral Tablet, Delayed Release (E.C.) Take 75 mg by mouth Twice daily    docusate sodium (COLACE) 100 mg Oral Capsule Take 1 Cap (100 mg total) by mouth Twice daily    escitalopram oxalate (LEXAPRO) 10 mg Oral Tablet Take 20 mg by mouth Once a day     L. ACIDOPHILUS/L. RHAMNOSUS (PROBIOTIC ORAL) Take by mouth    melatonin 10 mg Oral Tablet take 1 Tab by mouth Once a day.      MULTI-VITAMIN ORAL take 1 Tab by mouth Once a day.      oxyCODONE-acetaminophen (PERCOCET) 10-325 mg Oral Tablet Take 1 Tab by mouth Every 8 hours as needed for Pain    oxyCODONE-acetaminophen (PERCOCET) 5-325  mg Oral Tablet Take 1 Tab by mouth Every 4 hours as needed (Patient not taking: Reported on 06/09/2015)    PARoxetine (PAXIL) 20 mg Oral Tablet Take 20 mg by mouth Every morning    PSYLLIUM HUSK (METAMUCIL ORAL) take 1 Tab by mouth Once a day.      traZODone (DESYREL) 100 mg Oral Tablet Take 100 mg by mouth Once a day     Allergies   Allergen Reactions    Amoxicillin Anaphylaxis and Hives/ Urticaria     Past Surgical History:   Procedure Laterality Date    COLONOSCOPY      HX ACDF  08/28/2014    C5-6 and C6-7    HX HIP REPLACEMENT Right 2014    HX LUMBAR SPINE SURGERY  2007    HX OTHER  2016    Mohs surgery for melanoma    HX TONSILLECTOMY  childhood    HX WISDOM TEETH EXTRACTION           Social History   Substance Use Topics    Smoking status: Former Smoker     Packs/day: 1.00     Years: 10.00     Types: Cigars, Cigarettes     Quit date: 01/23/1983    Smokeless tobacco: Never Used      Comment: occasional cigar    Alcohol use No     Family History   Problem Relation Age of Onset    Alzheimer's/Dementia Mother     Emphysema Father             Review of Systems:  Denies constipation or sedation  All other systems negative except for any noted in the HPI      PHYSICAL EXAMINATION:  Vital Signs: BP 132/84   Pulse 50   Temp 36.6 C (97.9 F)   Resp 20   Ht 1.725 m (5' 7.91")   Wt 76.9 kg (169 lb 8.5 oz)   SpO2 97%   BMI 25.84 kg/m2  General: appears in good health, appears stated age  Eyes: Conjunctiva clear., Pupils equal and round.   HENT:ENMT without erythema or injection, mucous membranes moist.  Neck: supple, symmetrical, trachea midline  Lungs: Clear to auscultation and percussion bilaterally. , no accessory muscles, normal effot  Cardiovascular: regular rate and rhythm, S1, S2 normal, no murmur, click, rub or gallop  Abdomen: Soft, non-tender, Bowel sounds normal, non-distended  Extremities: extremities normal, atraumatic, no cyanosis or edema, no edema, redness or tenderness in the calves or thighs  Skin: Skin warm and dry and Skin color, texture, turgor normal. No rashes or lesions  Neurologic: Grossly normal, Gait is normal. , CN II - XII grossly intact , Alert and oriented x3  Lymphatics: No lymphadenopathy  Psychiatric: Normal affect, behavior, memory, thought content, judgement, and speech.  Musculoskeletal:   Gait and Station:Normal  Muscle Strength (upper extremeties): Normal  Muscle Strength (lower extremeties) : Normal  Muscle Tone (upper extremeties): Normal  Muscle Tone (lower extremeties): Normal  Sensation: Normal  Deep Tendon Reflexes upper and lower extremeties: Normal  Coordination: Normal  Hoffman's Reflex: Left: not tested Right: not tested  Ankle clonus: Left: Not present Right:Not present  Babinski: Left: absent Right: absent  Straight Leg Raise: Left: negative Right:negative  Crossed Straight Leg Raise: Left: negative Right: negative  Spurling's test: Left: not done Right: not done  Patrick's sign: Left: positive Right: negative  Musculoskeletal Tenderness: negative  PSIS tenderness present on the left side  Fortin  Finger sign positive  on the left side  Facet tenderness on the left side    Patient reports no suicidal or homicidal ideations.    Radiology Tests Ordered/ Reviewed: (Please indicate ordered or reviewed)  Reviewed: MRI:   Report noted:  05-26-14  reviewed      ASSESSMENT:    ICD-10-CM    1. Spondylosis of lumbar region without myelopathy or radiculopathy M47.816 PAIN CL FL FACET INJ LUMBAR SACRAL 2 LEVELS   2. Lumbar spondylosis M47.816 AMB [facility]SULT[facility]     PAIN CL FL FACET INJ LUMBAR SACRAL 2 LEVELS   3. Low back pain M54.5 AMB CONSULT/[facility]     PAIN CL FL FACET INJ LUMBAR SACRAL 2 LEVELS   4. Lumbar radicular pain M54.16 AMB CONSULT/[facility]     PAIN CL FL FACET INJ LUMBAR SACRAL 2 LEVELS   5. Back pain, unspecified back location, unspecified back pain laterality, unspecified chronicity M54.9 PAIN CL FL FACET INJ LUMBAR SACRAL 2 LEVELS   6. Pain of left sacroiliac joint M53.3          PLAN:  1. L4/L5, L5/S1 left LMBB ordered  2. Pt is to continue exercise, he is given stretches to perform  3. Pt to continue medications per PCP  4. Consider left sacroiliac joint injection    Our impression/plan was discussed with the patient.  The patient had a opportunity to discuss their plan of care and all questions were answered.    The patient was informed that improving their general level of fitness, weight control and core strength would be beneficial in controlling their chronic pain.    Burman Riis, APRN  The patient was seen independently with cosigning faculty present in the clinic.

## 2015-06-29 ENCOUNTER — Ambulatory Visit: Payer: Medicare Other | Attending: Anesthesiology | Admitting: Anesthesiology

## 2015-06-29 DIAGNOSIS — M545 Low back pain, unspecified: Secondary | ICD-10-CM

## 2015-06-29 DIAGNOSIS — M47816 Spondylosis without myelopathy or radiculopathy, lumbar region: Secondary | ICD-10-CM

## 2015-06-29 DIAGNOSIS — M5416 Radiculopathy, lumbar region: Secondary | ICD-10-CM | POA: Insufficient documentation

## 2015-06-29 DIAGNOSIS — M47817 Spondylosis without myelopathy or radiculopathy, lumbosacral region: Secondary | ICD-10-CM

## 2015-06-29 DIAGNOSIS — M549 Dorsalgia, unspecified: Secondary | ICD-10-CM

## 2015-06-29 NOTE — Procedures (Signed)
[facility]  [place] [address]  [phone]          PATIENT NAME: Ian Ellis  CHART [id number]  DATE OF BIRTH: 02-09-1950  DATE OF SERVICE:06/29/2015    SITE OF SERVICE  [facility].    PREOPERATIVE DIAGNOSIS:    Patient Active Problem List   Diagnosis    Hyperlipidemia    Pneumonia    Hx of pancreatitis    ACDF C5-6, C6-7    Back pain    Spondylosis of lumbosacral region without myelopathy or radiculopathy          POSTOPERATIVE DIAGNOSIS:  Same      NAME OF PROCEDURE:  Left  medial branch blocks at 2 levels in the lumbar area with fluoroscopic guidance.     SURGEON:  Julius Bowels, MD    ANESTHESIA: Local    ESTIMATED BLOOD LOSS:None    COMPLICATIONS:  None    DESCRIPTION OF PROCEDURE: After informed consent was obtained, the patient was placed in the prone position.  The back was prepped with Hibiclens and draped with a sterile drape.  Fluoroscopic guidance was used.  The junctions of theLeft  transverse processes at the  levels of L4  and L5 and Sacral Ala with the vertebral body were identified. The skin was infiltrated with 1% Xylocaine at each level, and 22-gauge, 3.5-inch needles were placed under fluoroscopic visualization.  2cc local anesthetic at each level of a mixture of 4cc Dexamethasone and 2cc 0.5% Bupivacaine were injected at each level.  The patient tolerated the procedure well.  The patient will follow up in clinic, with possible radiofrequency ablation in the future. For pre and post procedure pain scales,please see nurses notes.    Julius Bowels, MD 06/29/2015, 08:25  Associate Professor  Department of Anesthesiology and Psychiatry  Division of Pain Medicine    The patient has at least a 3 month history of:  1. Moderate to severe pain with functional impairment  2. Inadequate pain control with conservative care  3. Pain is predominantly axial  4. There is NO non facet pathology that better explains the patients  pain  5. Clinical assessment identifies the facet joint as the source of pain      I saw and examined the patient.  I reviewed the resident's note.  I agree with the findings and plan of care as documented in the resident's note.  Any exceptions/additions are edited/noted.            Julius Bowels MD 06/29/2015, 08:25

## 2015-06-29 NOTE — Patient Instructions (Signed)
[facility]  [place] [address]  Dept: [phone]  Dept Fax: [phone]  [phone]                                           SPECIAL PROCEDURES                                          DISCHARGE FORM                                               [phone]      Please follow the instructions listed below for your procedures.  If you have questions concerning your procedure, you may call and leave a message.  Messages will be returned by the end of the next business day.  If you have an emergency, proceed to your local Emergency Department.      PROCEDURE: Medial Branch Block-LEFT LUMBAR               1.  Do not drive a car or operate machinery today.  2.  It is normal to feel numb at the site of injection for several hours.  3.  No heavy lifting.  4.  Perform your normal daily activities, especially those that usually are bothersome.  5. Please keep a journal of your pain relief on the day of injection.      JOURNAL:  ______________________________________________________________________________________________________________________________________________________________________________________________________________________________________________________________________________________________________________________________________________________________________________________________________________________________________________________________________________________________________________________________________________________________________________________________________________________________________________________    These instructions have been reviewed with the patient and appropriate questions have answers.  Wandalee Ferdinand, LPN [id number]

## 2015-07-14 ENCOUNTER — Ambulatory Visit: Payer: Medicare Other | Attending: Family | Admitting: Family

## 2015-07-14 VITALS — BP 131/80 | HR 57 | Temp 96.7°F | Resp 20 | Ht 68.27 in | Wt 168.0 lb

## 2015-07-14 DIAGNOSIS — Z87891 Personal history of nicotine dependence: Secondary | ICD-10-CM | POA: Insufficient documentation

## 2015-07-14 DIAGNOSIS — M5137 Other intervertebral disc degeneration, lumbosacral region: Secondary | ICD-10-CM | POA: Insufficient documentation

## 2015-07-14 DIAGNOSIS — M549 Dorsalgia, unspecified: Secondary | ICD-10-CM

## 2015-07-14 DIAGNOSIS — I10 Essential (primary) hypertension: Secondary | ICD-10-CM | POA: Insufficient documentation

## 2015-07-14 DIAGNOSIS — M47816 Spondylosis without myelopathy or radiculopathy, lumbar region: Secondary | ICD-10-CM | POA: Insufficient documentation

## 2015-07-14 DIAGNOSIS — Z79899 Other long term (current) drug therapy: Secondary | ICD-10-CM | POA: Insufficient documentation

## 2015-07-14 DIAGNOSIS — F419 Anxiety disorder, unspecified: Secondary | ICD-10-CM | POA: Insufficient documentation

## 2015-07-14 DIAGNOSIS — M533 Sacrococcygeal disorders, not elsewhere classified: Secondary | ICD-10-CM | POA: Insufficient documentation

## 2015-07-14 DIAGNOSIS — Z85828 Personal history of other malignant neoplasm of skin: Secondary | ICD-10-CM | POA: Insufficient documentation

## 2015-07-14 DIAGNOSIS — IMO0002 Reserved for concepts with insufficient information to code with codable children: Secondary | ICD-10-CM

## 2015-07-14 NOTE — Progress Notes (Signed)
[facility]                              [facility]  [place] [address]  [phone]         History and Physical      Date of Service: 07/14/2015   Name: Ian Ellis   Date of Birth: July 05, 1950       Obtained History From: the patient    CHIEF COMPLAINT:   Chief Complaint   Patient presents with    Back Pain         PAIN PROCEDURES:  1. 06-29-15 left L4/L5, L5/S1 LMBB (75% relief)    IMAGES: 05-26-14 MRI reviewed    HPI:  This is a 65 y.o. male who presents for a follow up of left L4/L5, L5/S1 LMBB with 75% relief.   Onset: since 2007, gradual onset  Location: bilateral low back pain, worse on the left  Duration: constant, daily  Characteristics: dull, aching  Radiation: left posterior thigh  Aggravating factors: Extension, rotation, activity, sitting, coughing, and sneezing  Alleviating factors: rest  Associated symptoms: none  Timing: no timing    The patient has tried and failed conservative treatment of physical therapy, NSAIDs, acetaminophen, muscle relaxers, and percocet.     Pt has not had any loss of bowel or bladder or any myelopathy.    PAST MEDICAL/ FAMILY/ SOCIAL HISTORY:   Reviewed/ Summarized records and/or obtained History from patient  Past Medical History:   Diagnosis Date    Anxiety     BCC (basal cell carcinoma of skin)     torso, nose removed in office    Essential hypertension     Hyperlipidemia     Melanoma ([facility])          Current Outpatient Prescriptions   Medication Sig    ALPRAZolam (XANAX) 0.5 mg Oral Tablet Take 0.5 mg by mouth Every night as needed for Insomnia or Anxiety    amLODIPine (NORVASC) 5 mg Oral Tablet take 5 mg by mouth Once a day.      atorvastatin (LIPITOR) 20 mg Oral Tablet Take 20 mg by mouth Every evening    cyclobenzaprine (FLEXERIL) 10 mg Oral Tablet Take 1 Tab (10 mg total) by mouth Three times a day    diclofenac sodium (VOLTAREN) 75 mg Oral Tablet, Delayed Release (E.C.)  Take 75 mg by mouth Twice daily    docusate sodium (COLACE) 100 mg Oral Capsule Take 1 Cap (100 mg total) by mouth Twice daily    escitalopram oxalate (LEXAPRO) 10 mg Oral Tablet Take 20 mg by mouth Once a day     L. ACIDOPHILUS/L. RHAMNOSUS (PROBIOTIC ORAL) Take by mouth    melatonin 10 mg Oral Tablet take 1 Tab by mouth Once a day.      MULTI-VITAMIN ORAL take 1 Tab by mouth Once a day.      oxyCODONE-acetaminophen (PERCOCET) 10-325 mg Oral Tablet Take 1 Tab by mouth Every 8 hours as needed for Pain    oxyCODONE-acetaminophen (PERCOCET) 5-325 mg Oral Tablet Take 1 Tab by mouth Every 4 hours as needed    PARoxetine (PAXIL) 20 mg Oral Tablet Take 20 mg by mouth Every morning    PSYLLIUM HUSK (METAMUCIL ORAL) take 1 Tab by mouth Once a day.      traZODone (DESYREL) 100 mg Oral Tablet Take 100 mg by  mouth Once a day     Allergies   Allergen Reactions    Amoxicillin Anaphylaxis and Hives/ Urticaria     Past Surgical History:   Procedure Laterality Date    COLONOSCOPY      HX ACDF  08/28/2014    C5-6 and C6-7    HX HIP REPLACEMENT Right 2014    HX LUMBAR SPINE SURGERY  2007    HX OTHER  2016    Mohs surgery for melanoma    HX TONSILLECTOMY  childhood    HX WISDOM TEETH EXTRACTION           Social History   Substance Use Topics    Smoking status: Former Smoker     Packs/day: 1.00     Years: 10.00     Types: Cigars, Cigarettes     Quit date: 01/23/1983    Smokeless tobacco: Never Used      Comment: occasional cigar    Alcohol use No     Family History   Problem Relation Age of Onset    Alzheimer's/Dementia Mother     Emphysema Father            Review of Systems:  All other systems negative except for any noted in the HPI    PHYSICAL EXAMINATION:  Vital Signs: BP 131/80   Pulse 57   Temp 35.9 C (96.7 F)   Resp 20   Ht 1.734 m (5' 8.27")   Wt 76.2 kg (167 lb 15.9 oz)   SpO2 94%   BMI 25.34 kg/m2 Body mass index is 25.34 kg/(m^2).  General: appears in good health, appears stated age  Eyes:  Conjunctiva clear, Pupils equal and round.   HENT: mucous membranes moist.  Lungs: No accessory muscles, normal effort  Cardiovascular: +2 pulses X 4, no edema  Abdomen: Soft, non-tender, non-distended  Extremities: extremities normal, atraumatic, no cyanosis or edema, no edema, redness or tenderness in the calves or thighs  Skin: Skin warm and dry and Skin color, texture, turgor normal. No rashes or lesions  Neurologic: Grossly normal, Gait is normal. , CN II - XII grossly intact , Alert and oriented x3  Psychiatric: Normal affect, behavior, memory, thought content, judgement, and speech.  Musculoskeletal:   Gait and Station:Normal  Muscle Strength (upper extremeties): Not done  Muscle Strength (lower extremeties) : Normal  Muscle Tone (upper extremeties): Not done  Muscle Tone (lower extremeties): Normal  Sensation: Normal  Deep Tendon Reflexes upper and lower extremeties: Normal  Hoffman's Reflex: Left: not tested Right: not tested  Ankle clonus: Left: Not present Right:Not present  Straight Leg Raise: Left: negative Right:negative  Crossed Straight Leg Raise: Left: negative Right: negative  Spurling's test: Left: not done Right: not done  Patrick's sign: Left: positive, pain at SI joint Right: negative  Musculoskeletal Tenderness: negative  PSIS tenderness Left: positive Right:negative  Fortin Finger sign Left: positive Right:negative  Anterior Compression Left: positive Right:negative  Lateral Compression Force Test Left: positive Right:negative  Facet tenderness negative bilaterally    Patient reports no suicidal or homicidal ideations.    ASSESSMENT:    ICD-10-CM    1. Back pain, unspecified back location, unspecified back pain laterality, unspecified chronicity M54.9 PAIN CL FLUORO INJ SI JOINT, ARTHOGRAPHY, ANSET STEROID     INJ SI JOINT, ARTHROGRAPHY, ANSET STEROID (AMB ONLY)   2. Sacroiliac joint pain M53.3 PAIN CL FLUORO INJ SI JOINT, ARTHOGRAPHY, ANSET STEROID     INJ SI JOINT, ARTHROGRAPHY, ANSET  STEROID (  AMB ONLY)   3. DDD (degenerative disc disease), lumbosacral M51.37    4. Bulging disc [id number]    5. 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 Left sacroiliac joint injection ordered  2. Pt to continue HEP  3. Pt to continue diclofenac and flexeril as ordered by PCP  4. Follow up and work through as needed    Our impression/plan was discussed with the patient.  The patient had a opportunity to discuss their plan of care and all questions were answered. Pt educated on red flags of back pain and when to the go to the ED.    The patient was informed that improving their general level of fitness, weight control and core strength would be beneficial in controlling their chronic pain.      Burman Riis, APRN, FNP-C, 07/14/2015, 11:21  Nurse Practitioner with [facility]    The patient was seen independently with cosigning faculty present in the clinic.

## 2015-07-14 NOTE — Progress Notes (Signed)
Pain and Function:     Defense and Veterans Pain Rating Scale    On a scale of 0-10, what is your overall pain Rating:        On a scale of 0-10, during the past 24 hours, pain has interfered with you usual activity: 3     On a scale of 0-10, during the past 24 hours, pain has interfered with your sleep: 1    On a scale of 0-10, during the past 24 hours, pain has affected your mood: 0     On a scale of 0-10, during the past 24 hours, pain has contributed to your stress: 1

## 2015-08-17 ENCOUNTER — Ambulatory Visit: Payer: Medicare Other | Attending: Anesthesiology | Admitting: Anesthesiology

## 2015-08-17 DIAGNOSIS — M549 Dorsalgia, unspecified: Secondary | ICD-10-CM

## 2015-08-17 DIAGNOSIS — M533 Sacrococcygeal disorders, not elsewhere classified: Secondary | ICD-10-CM | POA: Insufficient documentation

## 2015-08-17 NOTE — Patient Instructions (Signed)
[facility]  [place] [address]  Dept: [phone]  Dept Fax: [phone]  [phone]                                           SPECIAL PROCEDURES                                          DISCHARGE FORM                                               [phone]      Please follow the instructions listed below for your procedures.  If you have questions concerning your procedure, you may call and leave a message.  Messages will be returned by the end of the next business day.  If you have an emergency, proceed to your local Emergency Department.      PROCEDURE: Sacroiliac Injection-LEFT               1.  Do not drive a car or operate machinery today.  2.  It is normal to feel numb at the site of injection for several hours.  3.  No heavy lifting.  4.  Perform your normal daily activities, especially those that usually are bothersome.  5. Please keep a journal of your pain relief on the day of injection.      JOURNAL:  ______________________________________________________________________________________________________________________________________________________________________________________________________________________________________________________________________________________________________________________________________________________________________________________________________________________________________________________________________________________________________________________________________________________________________________________________________________________________________________________    These instructions have been reviewed with the patient and appropriate questions have answers.  Wandalee Ferdinand, LPN [id number]

## 2015-08-17 NOTE — Procedures (Signed)
[facility]  [place] [address]  [phone]      PATIENT NAME: Ian Ellis  CHART [id number]  DATE OF BIRTH: February 10, 1950  DATE OF SERVICE:08/17/2015    SITE OF SERVICE  [facility].    PREOPERATIVE DIAGNOSIS: Left sacroiliac arthropathy.       POSTOPERATIVE DIAGNOSIS:  Same      NAME OF PROCEDURE: Right SI joint injection.  .     SURGEON:  Julius Bowels, MD        ANESTHESIA: Local        ESTIMATED BLOOD LOSS:None      COMPLICATIONS:  None      DESCRIPTION OF PROCEDURE: Consent was obtained.  The patient was taken to the procedure room and placed in the prone position.  The left sacroiliac joint was identified with fluoroscopy, and the inferior aspect of the joint was targeted.  Local infiltration with 1% Xylocaine was performed.  A 22-gauge, 3.5-inch needle was guided down to the inferior aspect of the SI joint, and 10 mL of volume was injected, including 0.2% Ropivacaine and 53mL of Dexamethasone.  The patient tolerated the procedure well.  The patient will follow up in clinic.    Julius Bowels, MD 08/17/2015, 08:59  AssociateProfessor   Department of Anesthesiology and Psychiatry  Director Outpatient Pain Services

## 2015-08-17 NOTE — Progress Notes (Signed)
Defense and Veterans Pain Rating Scale     On a scale of 0-10, during the past 24 hours, pain has interfered with you usual activity: 0     On a scale of 0-10, during the past 24 hours, pain has interfered with your sleep: 0    On a scale of 0-10, during the past 24 hours, pain has affected your mood: 0     On a scale of 0-10, during the past 24 hours, pain has contributed to your stress: 0     On a scale of 0-10, what is your overall pain Rating: 3

## 2015-09-02 ENCOUNTER — Encounter (INDEPENDENT_AMBULATORY_CARE_PROVIDER_SITE_OTHER): Payer: Self-pay | Admitting: Family

## 2015-09-02 ENCOUNTER — Ambulatory Visit: Payer: Medicare Other | Attending: Neurological Surgery | Admitting: Family

## 2015-09-02 VITALS — BP 142/84 | HR 54 | Temp 97.0°F | Resp 20 | Ht 67.8 in | Wt 173.5 lb

## 2015-09-02 DIAGNOSIS — Z85828 Personal history of other malignant neoplasm of skin: Secondary | ICD-10-CM | POA: Insufficient documentation

## 2015-09-02 DIAGNOSIS — Z87891 Personal history of nicotine dependence: Secondary | ICD-10-CM | POA: Insufficient documentation

## 2015-09-02 DIAGNOSIS — M4806 Spinal stenosis, lumbar region: Secondary | ICD-10-CM | POA: Insufficient documentation

## 2015-09-02 DIAGNOSIS — M549 Dorsalgia, unspecified: Secondary | ICD-10-CM

## 2015-09-02 DIAGNOSIS — Z96641 Presence of right artificial hip joint: Secondary | ICD-10-CM | POA: Insufficient documentation

## 2015-09-02 DIAGNOSIS — Z880 Allergy status to penicillin: Secondary | ICD-10-CM | POA: Insufficient documentation

## 2015-09-02 DIAGNOSIS — Z85820 Personal history of malignant melanoma of skin: Secondary | ICD-10-CM | POA: Insufficient documentation

## 2015-09-02 DIAGNOSIS — M47816 Spondylosis without myelopathy or radiculopathy, lumbar region: Secondary | ICD-10-CM | POA: Insufficient documentation

## 2015-09-02 DIAGNOSIS — I10 Essential (primary) hypertension: Secondary | ICD-10-CM | POA: Insufficient documentation

## 2015-09-02 DIAGNOSIS — IMO0002 Reserved for concepts with insufficient information to code with codable children: Secondary | ICD-10-CM

## 2015-09-02 DIAGNOSIS — E785 Hyperlipidemia, unspecified: Secondary | ICD-10-CM | POA: Insufficient documentation

## 2015-09-02 DIAGNOSIS — M5126 Other intervertebral disc displacement, lumbar region: Secondary | ICD-10-CM | POA: Insufficient documentation

## 2015-09-02 DIAGNOSIS — F419 Anxiety disorder, unspecified: Secondary | ICD-10-CM | POA: Insufficient documentation

## 2015-09-02 DIAGNOSIS — M25552 Pain in left hip: Secondary | ICD-10-CM | POA: Insufficient documentation

## 2015-09-02 DIAGNOSIS — Z79899 Other long term (current) drug therapy: Secondary | ICD-10-CM | POA: Insufficient documentation

## 2015-09-02 NOTE — Progress Notes (Signed)
Pain and Function:     Defense and Veterans Pain Rating Scale     On a scale of 0-10, during the past 24 hours, pain has interfered with you usual activity: 0     On a scale of 0-10, during the past 24 hours, pain has interfered with your sleep: 0    On a scale of 0-10, during the past 24 hours, pain has affected your mood: 0     On a scale of 0-10, during the past 24 hours, pain has contributed to your stress: 0     On a scale of 0-10, what is your overall pain Rating: 3

## 2015-09-02 NOTE — Progress Notes (Signed)
[facility]                              [facility]  [place] [address]  [phone]         History and Physical      Date of Service: 09/02/2015   Name: Ian Ellis   Date of Birth: Oct 31, 1950       Obtained History From: the patient    CHIEF COMPLAINT:   Chief Complaint   Patient presents with    Back Pain         PAIN PROCEDURES:  1. 06-29-15 left L4/5, L5/S1 LMBB (75% relief)  2. 08-17-15 left SI injection (0% relief)    IMAGES: reviewed    HPI:  This is a 65 y.o. male who presents for a follow up of a left SI injection with 0% relief.   Onset: since 2007, gradual onset  Location: bilateral low back pain, worse on the left  Duration: constant, daily  Characteristics: dull, aching  Radiation: none  Aggravating factors: Extension, rotation, activity, sitting  Alleviating factors: rest  Associated symptoms: none. He has no leg pain, paresthesias, or weakness with ambulation. He walks for an hour daily on the treadmill.   Timing: no timing    The patient has tried and failed conservative treatment of physical therapy, NSAIDs, acetaminophen, muscle relaxers, and percocet.     Pt has not had any loss of bowel or bladder or any myelopathy.  PAST MEDICAL/ FAMILY/ SOCIAL HISTORY:   Reviewed/ Summarized records and/or obtained History from patient  Past Medical History:   Diagnosis Date    Anxiety     BCC (basal cell carcinoma of skin)     torso, nose removed in office    Essential hypertension     Hyperlipidemia     Melanoma (HCC)          Current Outpatient Prescriptions   Medication Sig    ALPRAZolam (XANAX) 0.5 mg Oral Tablet Take 0.5 mg by mouth Every night as needed for Insomnia or Anxiety    amLODIPine (NORVASC) 5 mg Oral Tablet take 5 mg by mouth Once a day.      atorvastatin (LIPITOR) 20 mg Oral Tablet Take 20 mg by mouth Every evening    cyclobenzaprine (FLEXERIL) 10 mg Oral Tablet Take 1 Tab (10 mg total) by mouth  Three times a day    diclofenac sodium (VOLTAREN) 75 mg Oral Tablet, Delayed Release (E.C.) Take 75 mg by mouth Twice daily    docusate sodium (COLACE) 100 mg Oral Capsule Take 1 Cap (100 mg total) by mouth Twice daily    escitalopram oxalate (LEXAPRO) 10 mg Oral Tablet Take 20 mg by mouth Once a day     L. ACIDOPHILUS/L. RHAMNOSUS (PROBIOTIC ORAL) Take by mouth    melatonin 10 mg Oral Tablet take 1 Tab by mouth Once a day.      MULTI-VITAMIN ORAL take 1 Tab by mouth Once a day.      oxyCODONE-acetaminophen (PERCOCET) 10-325 mg Oral Tablet Take 1 Tab by mouth Every 8 hours as needed for Pain    oxyCODONE-acetaminophen (PERCOCET) 5-325 mg Oral Tablet Take 1 Tab by mouth Every 4 hours as needed    PARoxetine (PAXIL) 20 mg Oral Tablet Take 20 mg by mouth Every morning    PSYLLIUM HUSK (METAMUCIL ORAL) take 1 Tab by  mouth Once a day.      traZODone (DESYREL) 100 mg Oral Tablet Take 100 mg by mouth Once a day     Allergies   Allergen Reactions    Amoxicillin Anaphylaxis and Hives/ Urticaria     Past Surgical History:   Procedure Laterality Date    COLONOSCOPY      HX ACDF  08/28/2014    C5-6 and C6-7    HX HIP REPLACEMENT Right 2014    HX LUMBAR SPINE SURGERY  2007    HX OTHER  2016    Mohs surgery for melanoma    HX TONSILLECTOMY  childhood    HX WISDOM TEETH EXTRACTION           Social History   Substance Use Topics    Smoking status: Former Smoker     Packs/day: 1.00     Years: 10.00     Types: Cigars, Cigarettes     Quit date: 01/23/1983    Smokeless tobacco: Never Used      Comment: occasional cigar    Alcohol use No     Family History   Problem Relation Age of Onset    Alzheimer's/Dementia Mother     Emphysema Father        Review of Systems:  All other systems negative except for any noted in the HPI    PHYSICAL EXAMINATION:  Vital Signs: BP (!) 142/84 Comment: Pt stated he takes his B/P meds in the evening   Pulse 54   Temp 36.1 C (97 F)   Resp 20   Ht 1.722 m (5' 7.79")   Wt 78.7 kg (173  lb 8 oz)   SpO2 96%   BMI 26.54 kg/m2 Body mass index is 26.54 kg/(m^2).  General: appears in good health, appears stated age  Eyes: Conjunctiva clear, Pupils equal and round.   HENT: mucous membranes moist.  Lungs: No accessory muscles, normal effort  Cardiovascular: +2 pulses X 4, no edema  Abdomen: Soft, non-tender, non-distended  Extremities: extremities normal, atraumatic, no cyanosis or edema, no redness or tenderness in the calves or thighs  Skin: Skin warm and dry and Skin color, texture, turgor normal. No rashes or lesions  Neurologic: Grossly normal, Gait is normal. , CN II - XII grossly intact , Alert and oriented x3  Psychiatric: Normal affect, behavior, memory, thought content, judgement, and speech.  Musculoskeletal:   Gait and Station:Normal  Facet tenderness bilateral L4/5, L5/S1    Patient reports no suicidal or homicidal ideations.    ASSESSMENT:    ICD-10-CM    1. Back pain, unspecified back location, unspecified back pain laterality, unspecified chronicity M54.9 PAIN CL FL FACET INJ LUMBAR SACRAL 2 LEVELS     FACET LUMBAR/SACRAL ADD'L LEVEL (3) (AMB ONLY)   2. Spondylosis of lumbar region without myelopathy or radiculopathy M47.816 PAIN CL FL FACET INJ LUMBAR SACRAL 2 LEVELS     FACET LUMBAR/SACRAL ADD'L LEVEL (3) (AMB ONLY)   low back pain  Left hip pain  Lumbar spondylosis  DDD  Lumbar stenosis  Disc bulging at L2-3, L3-4, L4-5    PLAN:  1. Bilateral therapeutic L4/5, L5/S1 LMBB ordered. Therapeutic ordered because patient is [place] a different [place]. He has only had one left LMBB and has not had a right sided LMBB- cannot get RFA before leaving. He will have to find a clinic in [place] to do injections  2. Pt to continue HEP  3. Continue Voltaren and flexeril as ordered  Our impression/plan was discussed with the patient.  The patient had a opportunity to discuss their plan of care and all questions were answered. Pt educated on red flags of back pain and when to the go to the ED.    The  patient was informed that improving their general level of fitness, weight control and core strength would be beneficial in controlling their chronic pain.      Burman Riis, APRN, FNP-C, 09/02/2015, 14:18  Nurse Practitioner with [facility]    The patient was seen independently with cosigning faculty present in the clinic.    This patient was seen independently by the above provider.  I did not participate in evaluating the patient, determining the the plan of care, or documenting the visit.  I may not have been present in the facility while the patient was evaluated.  I am signing this note to stay in compliance with institutional policy.  My signature does not reflect agreement with the documentation or plan above.    Chipper Oman, MD

## 2015-09-14 ENCOUNTER — Ambulatory Visit: Payer: Medicare Other | Attending: Neurological Surgery | Admitting: Neurological Surgery

## 2015-09-14 DIAGNOSIS — M549 Dorsalgia, unspecified: Secondary | ICD-10-CM

## 2015-09-14 DIAGNOSIS — M47816 Spondylosis without myelopathy or radiculopathy, lumbar region: Secondary | ICD-10-CM

## 2015-09-14 DIAGNOSIS — M5416 Radiculopathy, lumbar region: Secondary | ICD-10-CM

## 2015-09-14 DIAGNOSIS — M545 Low back pain, unspecified: Secondary | ICD-10-CM

## 2015-09-14 NOTE — Progress Notes (Signed)
Pain and Function:     Defense and Veterans Pain Rating Scale     On a scale of 0-10, during the past 24 hours, pain has interfered with you usual activity: 0     On a scale of 0-10, during the past 24 hours, pain has interfered with your sleep: 0    On a scale of 0-10, during the past 24 hours, pain has affected your mood: 0     On a scale of 0-10, during the past 24 hours, pain has contributed to your stress: 0     On a scale of 0-10, what is your overall pain Rating: 3

## 2015-09-14 NOTE — Patient Instructions (Signed)
[facility]  [place] [address]  Dept: [phone]  Dept Fax: [phone]  [phone]                                           SPECIAL PROCEDURES                                          DISCHARGE FORM                                               [phone]      Please follow the instructions listed below for your procedures.  If you have questions concerning your procedure, you may call and leave a message.  Messages will be returned by the end of the next business day.  If you have an emergency, proceed to your local Emergency Department.      PROCEDURE: Medial Branch Block-BILATERAL LUMBAR               1.  Do not drive a car or operate machinery today.  2.  It is normal to feel numb at the site of injection for several hours.  3.  No heavy lifting.  4.  Perform your normal daily activities, especially those that usually are bothersome.  5. Please keep a journal of your pain relief on the day of injection.      JOURNAL:  ______________________________________________________________________________________________________________________________________________________________________________________________________________________________________________________________________________________________________________________________________________________________________________________________________________________________________________________________________________________________________________________________________________________________________________________________________________________________________________________    These instructions have been reviewed with the patient and appropriate questions have answers.  Wandalee Ferdinand, LPN [id number]

## 2015-09-14 NOTE — Procedures (Signed)
Lumbar Medial Branch Blocks    Side:  bilateral    Facets Blocked:  L4/5/S1    Skin:  6ml 1% lido    Injectate:  6ml .25% bupi    Consent and timeout done.  Skin prepped and draped.  Sterility maintained throughout.  Junction of SAP and transverse process at:       L4/5 as well as the sacral ala identified.  Skin anesthetized.  22 gauge spinal needles advanced until periosteum contacted at the above mentioned sites.  After negative aspiration, injectate administered.  Patient tolerated well.    Shakyia Bosso, MD

## 2021-07-03 IMAGING — MR MRI THORACIC SPINE WITHOUT CONTRAST
5 of 10 series · 17 of 48 positions shown · IV contrast (gadolinium)
Comparison: MRI cervical spine July 03, 2021. Cervical and thoracic x-rays June 17, 2021.

________________________________________________________________________________________________ 
MRI THORACIC SPINE WITHOUT CONTRAST, 07/03/2021 [DATE]: 
CLINICAL INDICATION: History of cervical spine fusion. Chronic neck pain and 
occasional hand numbness. Evaluate for cord compression..
TECHNIQUE: Multiplanar, multiecho position MR images of the thoracic spine were 
performed without intravenous gadolinium enhancement. Patient was scanned on a 
1.5 Tesla magnet.

[Series 101: survey · axial · 10.0mm · 1.67mm/px · z∈[-30,+199]mm · 3 of 14 slices shown]
[im 1/14]
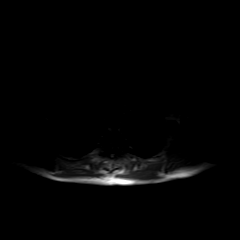
[im 7/14]
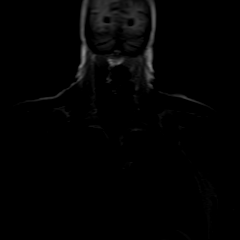
[im 14/14]
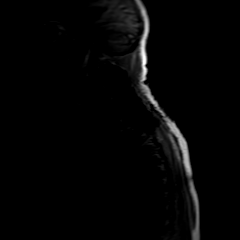

[Series 201: t2w_cor-surv · coronal · 10.0mm · 0.88mm/px · 2 of 7 slices shown]
[im 1/7]
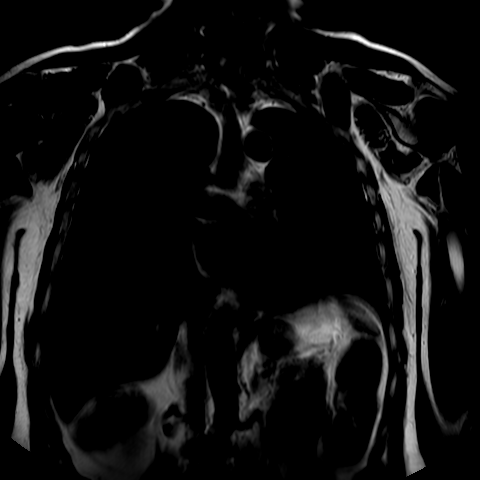
[im 7/7]
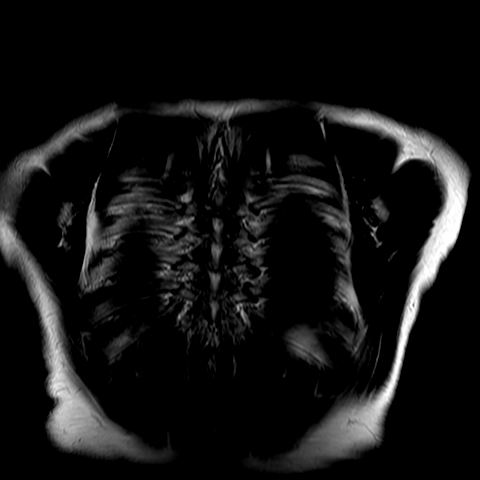

[Series 401: T1 · sagittal · 5.5mm · 0.66mm/px · 4 of 15 slices shown (1 of 3)]
[im 1/15]
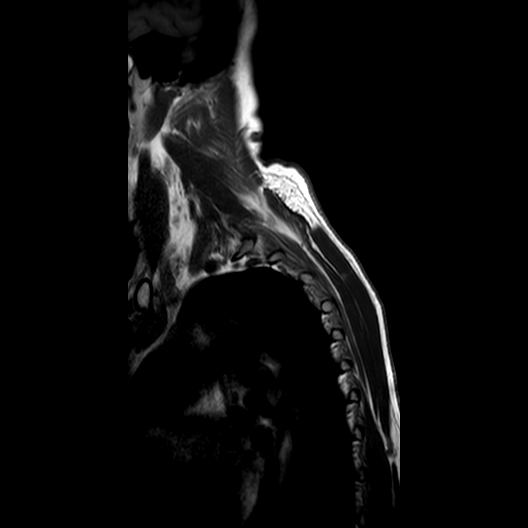
[im 5/15]
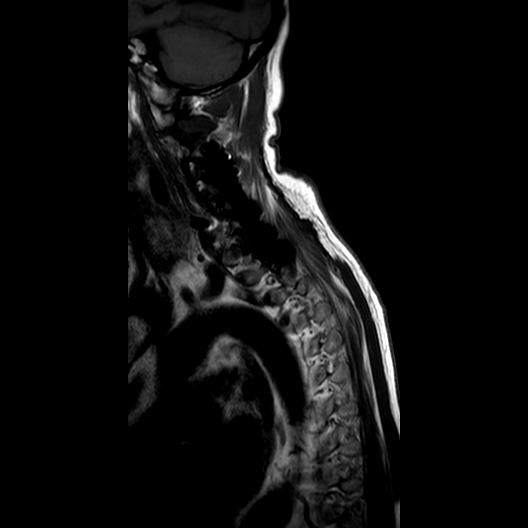
[im 10/15]
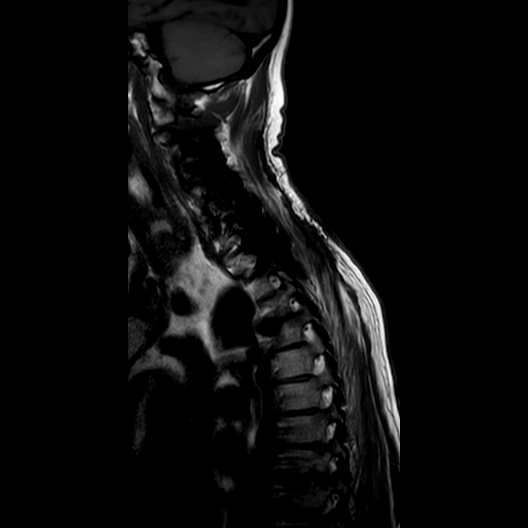
[im 15/15]
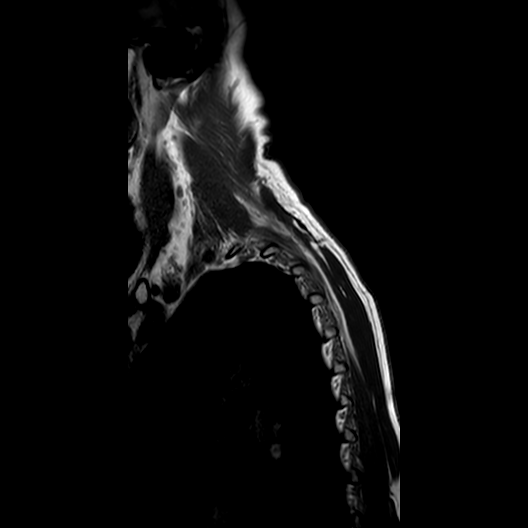

[Series 402: T1 · sagittal · 5.5mm · 0.66mm/px · 4 of 15 slices shown (2 of 3)]
[im 1/15]
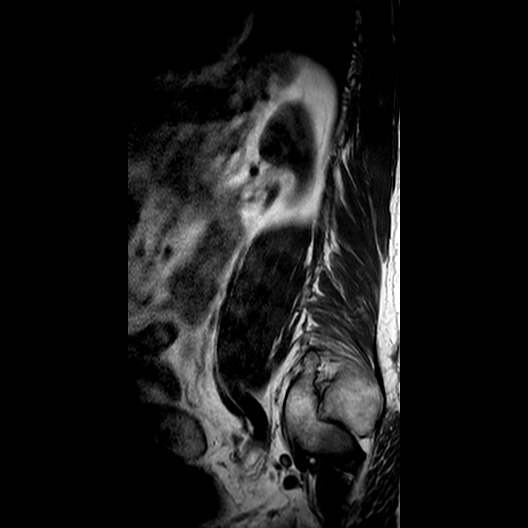
[im 5/15]
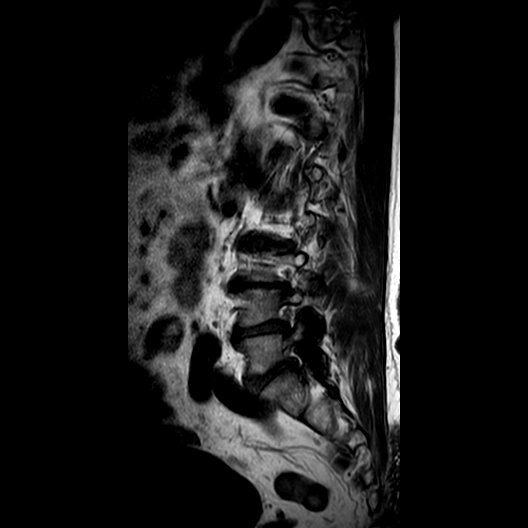
[im 10/15]
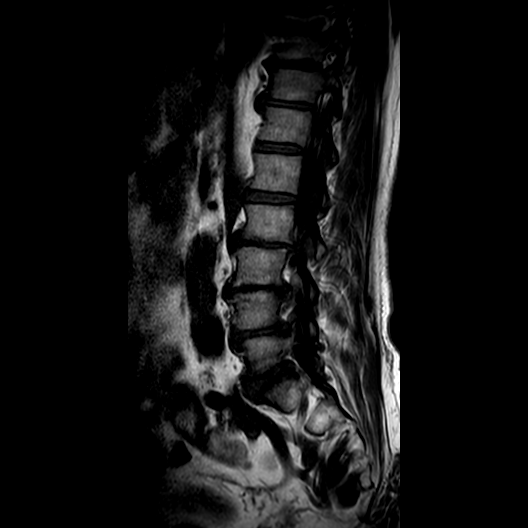
[im 15/15]
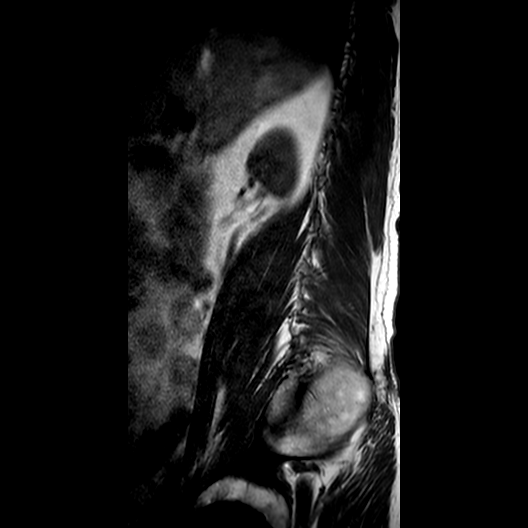

[Series 403: T1 · sagittal · 5.5mm · 0.66mm/px · 4 of 15 slices shown (3 of 3)]
[im 1/15]
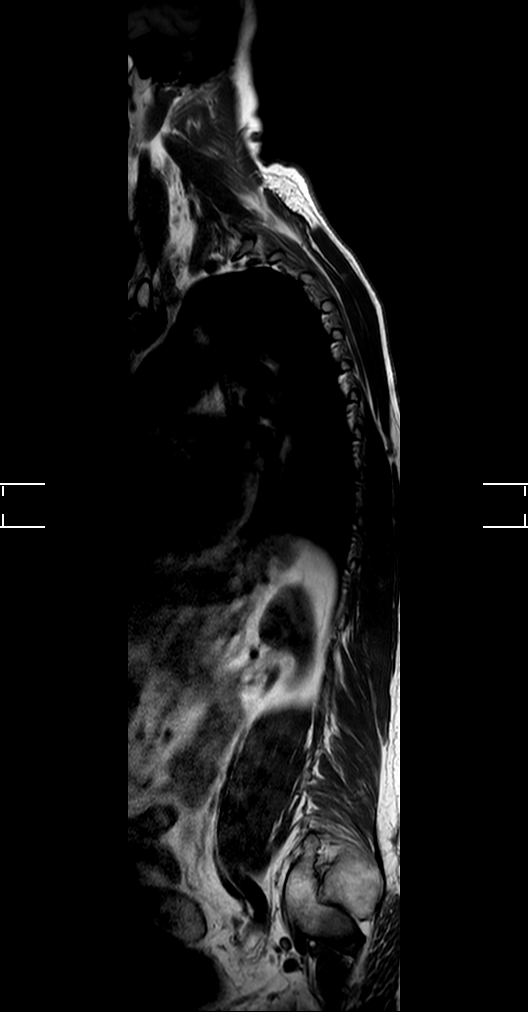
[im 5/15]
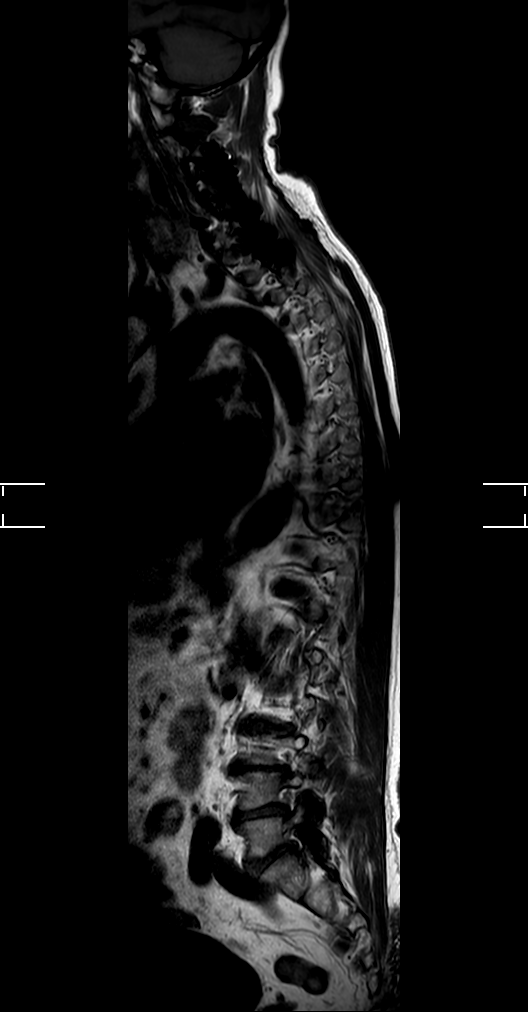
[im 10/15]
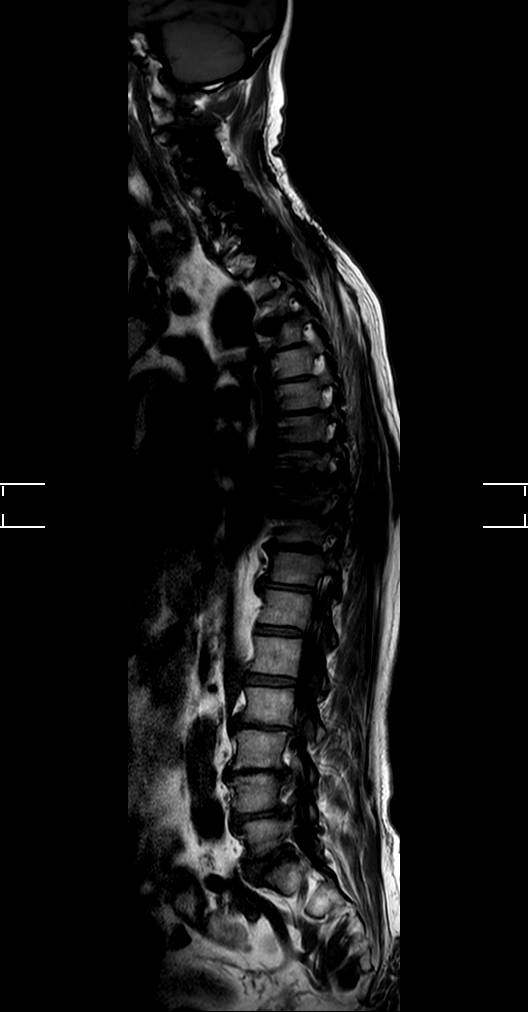
[im 15/15]
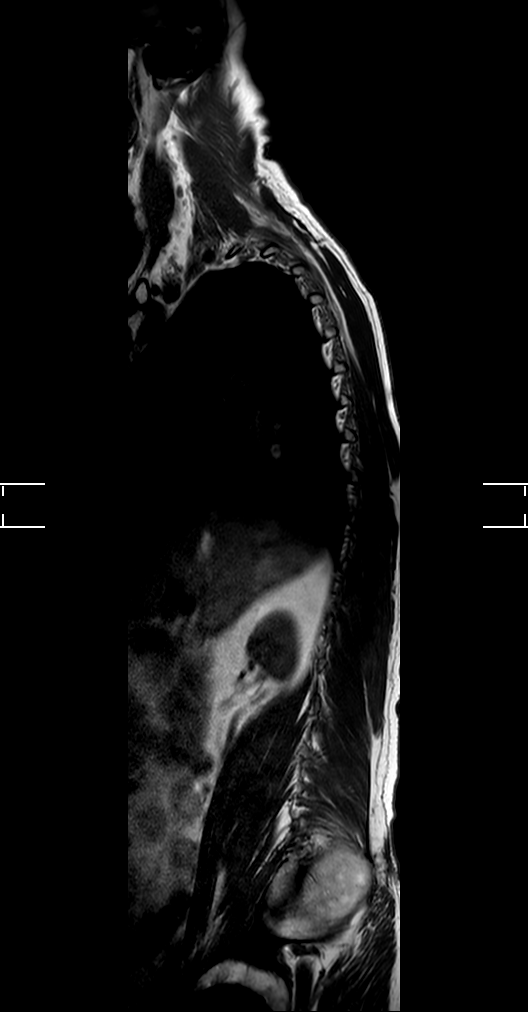

[17 of 48 positions shown; findings below may reference images not displayed]

FINDINGS: Anterior spinal fusion hardware is noted extending to the C7 vertebral 
body. There is posterior spinal fusion hardware extending to the T2 level. 
Hardware is better visualized on comparison CT study. 
-------------------------------------------------------------------------------- 
------ 
GENERAL: Multilevel variable degrees of loss of disc height, loss of disc 
signal, and Schmorls node formation. 
ALIGNMENT: Anatomic sagittal and coronal alignment. 
VERTEBRAL BODY HEIGHT: Normal.  
MARROW SIGNAL: Type I Modic endplate signal changes T3-T4. More mild type I 
Modic endplate signal changes T4-T5 and T8-T9 levels. 
CORD SIGNAL: Normal. 
ADDITIONAL FINDINGS: None. 
-------------------------------------------------------------------------------- 
------ 
RELEVANT SEGMENTAL (levels with severe stenosis or significant findings): 
No evidence of significant or critical central canal or foraminal narrowing. 
Mild degrees of generalized annular bulging present in numerous thoracic levels, 
but without significant canal stenosis. None of these regions of disc bulging 
abut or indent the ventral cord margin. No evidence of active facet arthritis. 
Additional scattered discogenic/degenerative changes are noted. 
-------------------------------------------------------------------------------- 
------
IMPRESSION: Cervical thoracic fusion changes detailed above. No evidence of significant or 
critical central canal or foraminal narrowing with mild thoracic degenerative 
changes detailed above.

## 2021-07-03 IMAGING — MR MRI CERVICAL SPINE WITHOUT CONTRAST
6 of 9 series · 18 of 48 positions shown · IV contrast (gadolinium)
Comparison: MRI of thoracic spine July 03, 2021. CT cervical and thoracic spine

________________________________________________________________________________________________ 
MRI CERVICAL SPINE WITHOUT CONTRAST, 07/03/2021 [DATE]: 
CLINICAL INDICATION: Unspecified Cord Compression. Prior cervical spine fusion. 
Chronic neck pain with occasional hand numbness.
TECHNIQUE: Multiplanar, multiecho position MR images of the cervical spine were 
performed without intravenous gadolinium enhancement. Patient was scanned on a 
1.5T magnet.

[Series 101: survey* · axial · 10.0mm · 1.56mm/px · z∈[-30,+199]mm · 3 of 15 slices shown]
[im 1/15]
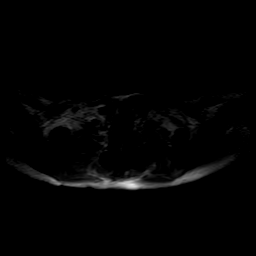
[im 8/15]
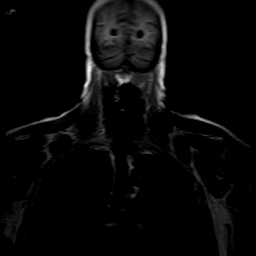
[im 15/15]
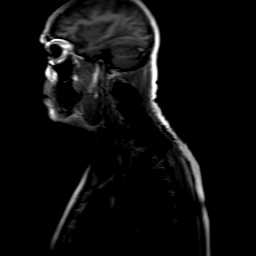

[Series 201: t2w_cor-surv · coronal · 5.0mm · 0.85mm/px · 2 of 7 slices shown]
[im 1/7]
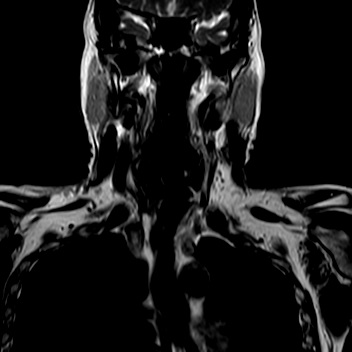
[im 7/7]
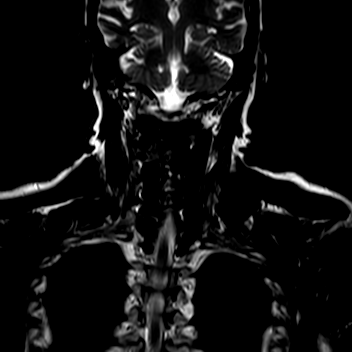

[Series 301: t1_(person_name) · sagittal · 3.0mm · 0.43mm/px · 4 of 15 slices shown]
[im 1/15]
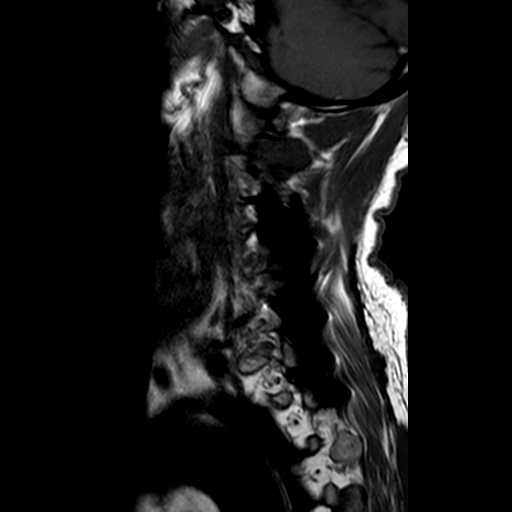
[im 5/15]
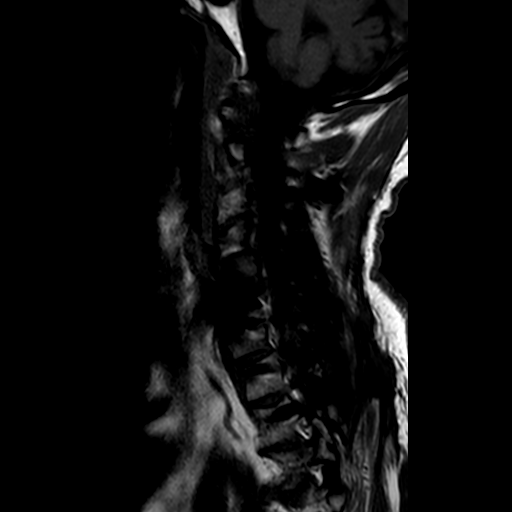
[im 10/15]
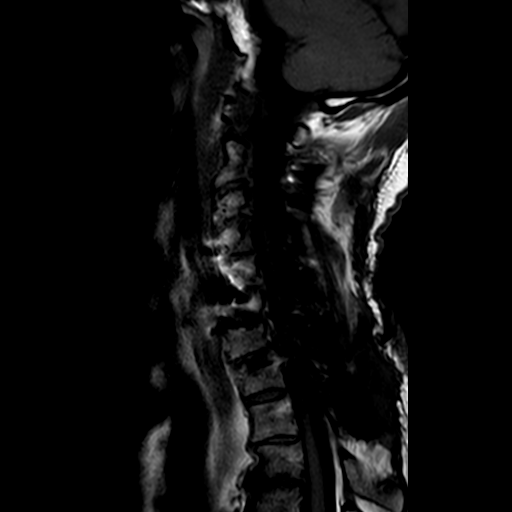
[im 15/15]
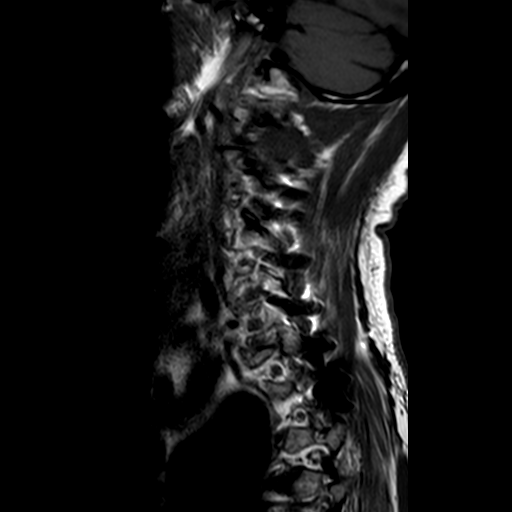

[Series 401: t2w_mv_xd_sag · sagittal · 3.0mm · 0.31mm/px · 4 of 15 slices shown]
[im 1/15]
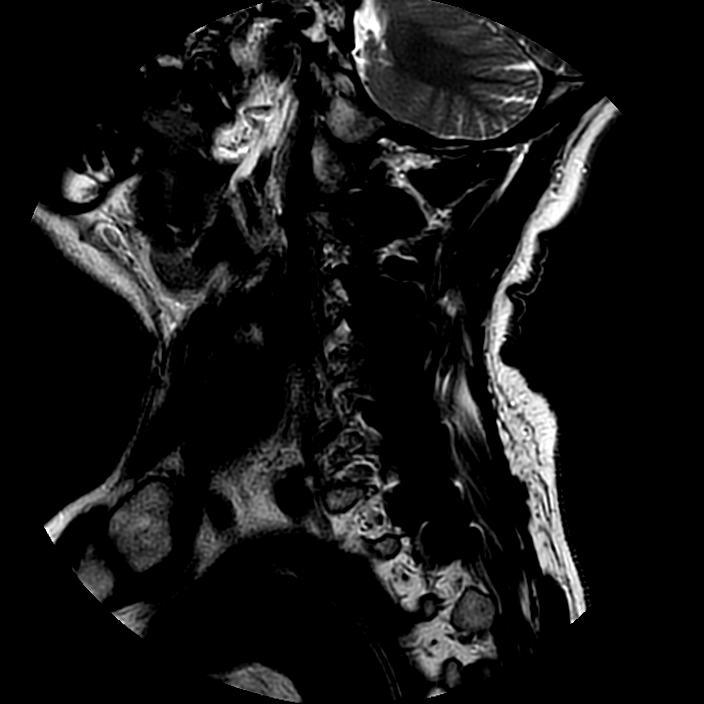
[im 5/15]
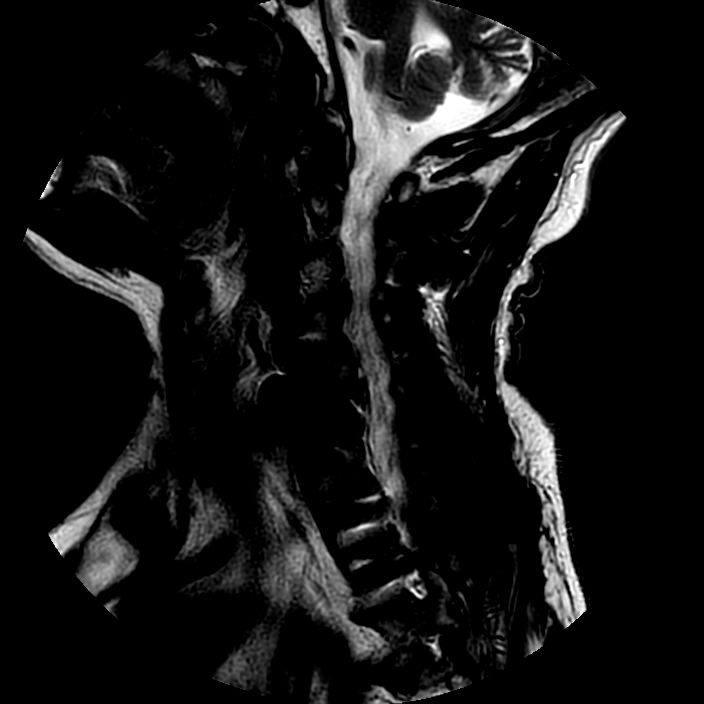
[im 10/15]
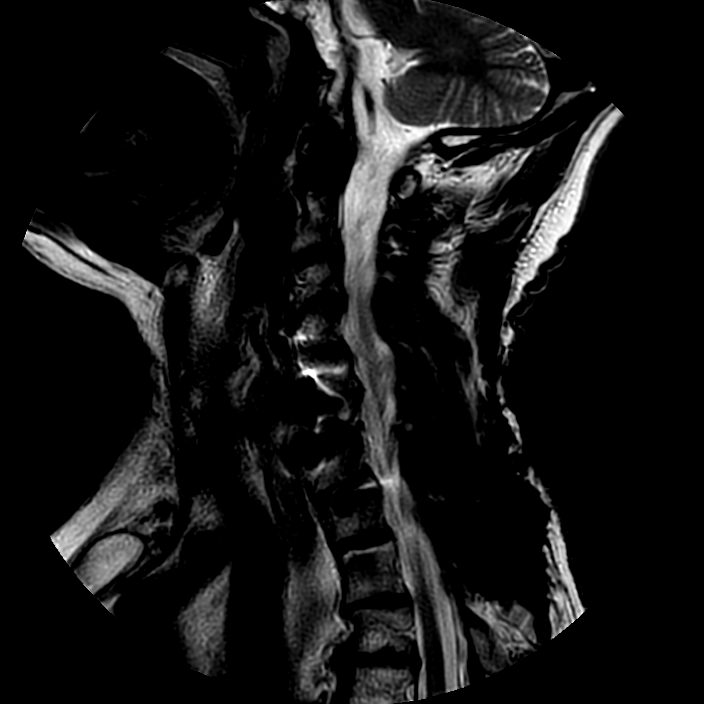
[im 15/15]
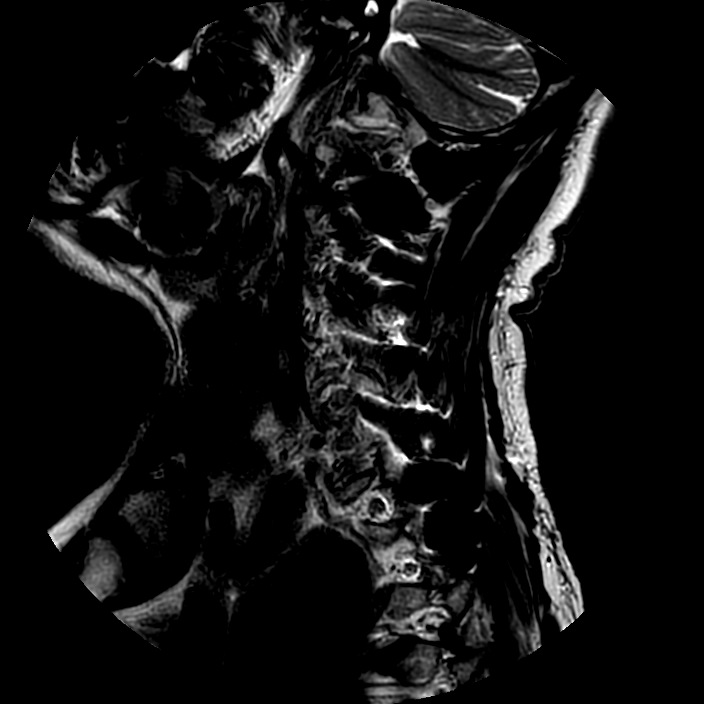

[Series 501: stir_(person_name) · sagittal · 3.0mm · 0.46mm/px · 4 of 15 slices shown]
[im 1/15]
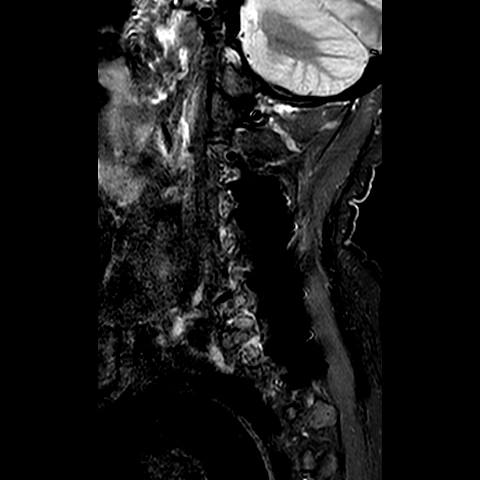
[im 5/15]
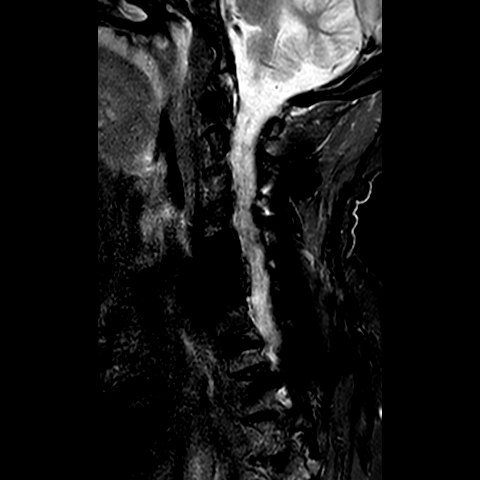
[im 10/15]
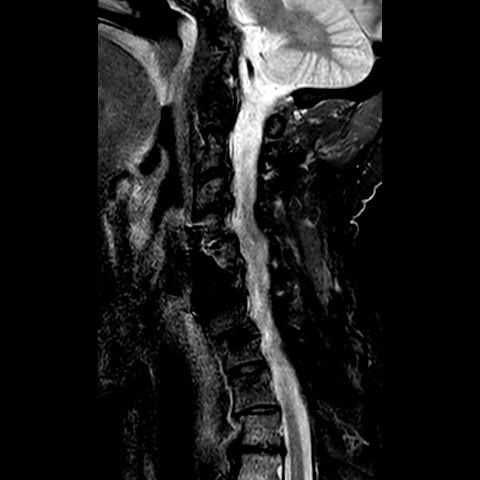
[im 15/15]
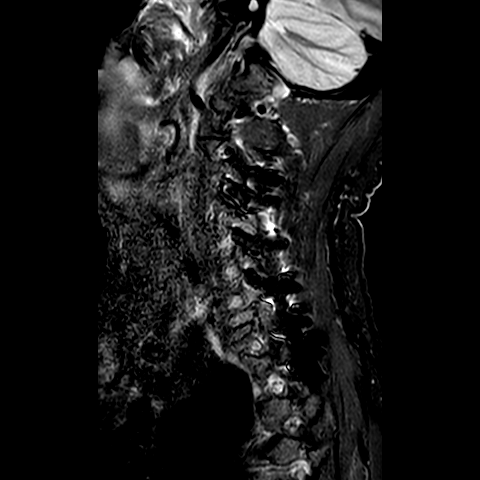

[Series 601: t2_(person_name)_(person_name) · axial · 3.0mm · 0.29mm/px · 1 of 45 slices shown]
[im 1/45]
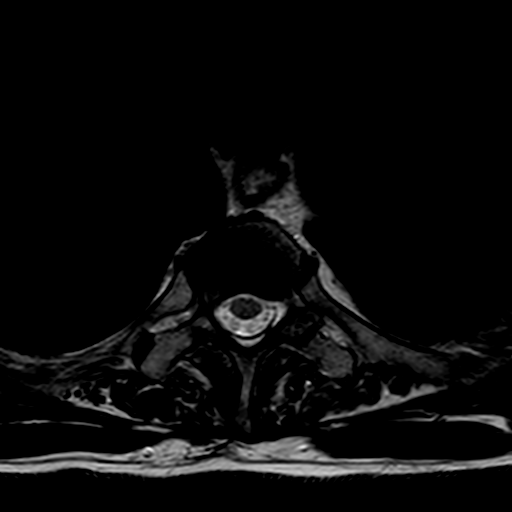

[18 of 48 positions shown; findings below may reference images not displayed]

FINDINGS: Again identified are changes of ACDF C4-C5. There is additional 
hardware present C5-C6 and C6-C7 levels. There is extensive posterior spinal 
fusion from C2 through T2. The hardware is much better visualized on comparison 
CT study. 
-------------------------------------------------------------------------------- 
----------------- 
GENERAL: 
ALIGNMENT: There is slight loss of the normal cervical lordosis. Trace appearing 
2-3 mm retrolisthesis C3 on C4 is stable. Otherwise there is anatomic sagittal 
and coronal alignment. 
VERTEBRAL BODY HEIGHT: Given limitations of susceptibility artifact, vertebral 
body height is preserved.  
MARROW SIGNAL: There are Modic type I endplate signal changes T3-T4. No other 
marrow edema. 
CORD SIGNAL: Normal.  
ADDITIONAL FINDINGS: Wall thickening involving the visualized portions of the 
upper thoracic esophagus. Correlate for dysphagia. And consider further 
evaluation with esophagram or EGD as clinically indicated. 
-------------------------------------------------------------------------------- 
---------------- 
SEGMENTAL: 
CRANIOCERVICAL JUNCTION: No significant stenosis. 
C2-C3: Mild loss of disc height. Small right paracentral disc osteophyte 
protrusion. Minimal canal narrowing. Minimal right foraminal narrowing. Left 
foramen patent. Facets difficult to visualize. 
C3-C4: Moderate loss of disc height. Borderline canal stenosis. Moderate right 
and mild/moderate left foraminal narrowing. Facets difficult to visualize. 
C4-C5: Fusion level. Canal and foramina are patent. Facets difficult to 
visualize. 
C5-C6: Fusion level. Canal and right foramen patent. Mild narrowing left neural 
foramen. Facets difficult to visualize. 
C6-C7: Fusion level. Canal and foramina are patent. Facets are difficult to 
visualize. 
C7-T1: Moderate loss of disc height. Canal and foramina are patent. Facets are 
difficult to visualize. 
-------------------------------------------------------------------------------- 
---------------
IMPRESSION: Extensive anterior and posterior cervical spinal fusion. No evidence of 
significant or critical central canal or foraminal narrowing as detailed above. 
There is wall thickening involving the visualized portions of the upper thoracic 
esophagus. Correlate for dysphagia and consider further evaluation with 
esophagram or EGD as clinically indicated.
